# Patient Record
Sex: Male | Born: 1950 | Race: White | Hispanic: No | Marital: Married | Smoking: Never smoker
Health system: Southern US, Community
[De-identification: ages and names within clinical notes are randomized; demographics above are authoritative.]

## PROBLEM LIST (undated history)

## (undated) DIAGNOSIS — Z8739 Personal history of other diseases of the musculoskeletal system and connective tissue: Secondary | ICD-10-CM

## (undated) DIAGNOSIS — Z87442 Personal history of urinary calculi: Secondary | ICD-10-CM

## (undated) DIAGNOSIS — R011 Cardiac murmur, unspecified: Secondary | ICD-10-CM

## (undated) DIAGNOSIS — N210 Calculus in bladder: Secondary | ICD-10-CM

## (undated) DIAGNOSIS — E785 Hyperlipidemia, unspecified: Secondary | ICD-10-CM

## (undated) DIAGNOSIS — Z9289 Personal history of other medical treatment: Secondary | ICD-10-CM

## (undated) DIAGNOSIS — M1990 Unspecified osteoarthritis, unspecified site: Secondary | ICD-10-CM

## (undated) DIAGNOSIS — R112 Nausea with vomiting, unspecified: Secondary | ICD-10-CM

## (undated) DIAGNOSIS — R350 Frequency of micturition: Secondary | ICD-10-CM

## (undated) DIAGNOSIS — Z973 Presence of spectacles and contact lenses: Secondary | ICD-10-CM

## (undated) DIAGNOSIS — N200 Calculus of kidney: Secondary | ICD-10-CM

## (undated) DIAGNOSIS — I10 Essential (primary) hypertension: Secondary | ICD-10-CM

## (undated) DIAGNOSIS — Z789 Other specified health status: Secondary | ICD-10-CM

## (undated) DIAGNOSIS — Z9889 Other specified postprocedural states: Secondary | ICD-10-CM

## (undated) DIAGNOSIS — Z87828 Personal history of other (healed) physical injury and trauma: Secondary | ICD-10-CM

## (undated) DIAGNOSIS — Z86010 Personal history of colonic polyps: Secondary | ICD-10-CM

## (undated) HISTORY — PX: APPENDECTOMY: SHX54

## (undated) HISTORY — PX: TONSILLECTOMY: SUR1361

---

## 1997-10-12 ENCOUNTER — Inpatient Hospital Stay (HOSPITAL_COMMUNITY): Admission: RE | Admit: 1997-10-12 | Discharge: 1997-10-12 | Payer: Self-pay | Admitting: Neurosurgery

## 1997-11-04 ENCOUNTER — Ambulatory Visit (HOSPITAL_COMMUNITY): Admission: RE | Admit: 1997-11-04 | Discharge: 1997-11-04 | Payer: Self-pay | Admitting: Neurosurgery

## 1997-11-04 ENCOUNTER — Encounter: Payer: Self-pay | Admitting: Neurosurgery

## 1997-12-06 ENCOUNTER — Encounter: Payer: Self-pay | Admitting: Neurosurgery

## 1997-12-06 ENCOUNTER — Ambulatory Visit (HOSPITAL_COMMUNITY): Admission: RE | Admit: 1997-12-06 | Discharge: 1997-12-06 | Payer: Self-pay | Admitting: Neurosurgery

## 2011-01-04 ENCOUNTER — Encounter: Payer: Self-pay | Admitting: Emergency Medicine

## 2011-01-04 ENCOUNTER — Emergency Department (HOSPITAL_COMMUNITY): Payer: Self-pay

## 2011-01-04 ENCOUNTER — Inpatient Hospital Stay (HOSPITAL_COMMUNITY)
Admission: EM | Admit: 2011-01-04 | Discharge: 2011-01-08 | DRG: 699 | Disposition: A | Discharge status: Home / Self Care | Payer: Self-pay | Attending: Surgery | Admitting: Surgery

## 2011-01-04 DIAGNOSIS — D62 Acute posthemorrhagic anemia: Secondary | ICD-10-CM | POA: Diagnosis present

## 2011-01-04 DIAGNOSIS — S37039A Laceration of unspecified kidney, unspecified degree, initial encounter: Principal | ICD-10-CM | POA: Diagnosis present

## 2011-01-04 DIAGNOSIS — W19XXXA Unspecified fall, initial encounter: Secondary | ICD-10-CM | POA: Diagnosis present

## 2011-01-04 DIAGNOSIS — E669 Obesity, unspecified: Secondary | ICD-10-CM | POA: Diagnosis present

## 2011-01-04 DIAGNOSIS — S37019A Minor contusion of unspecified kidney, initial encounter: Secondary | ICD-10-CM

## 2011-01-04 DIAGNOSIS — S37032A Laceration of left kidney, unspecified degree, initial encounter: Secondary | ICD-10-CM | POA: Diagnosis present

## 2011-01-04 DIAGNOSIS — Z6841 Body Mass Index (BMI) 40.0 and over, adult: Secondary | ICD-10-CM

## 2011-01-04 DIAGNOSIS — W108XXA Fall (on) (from) other stairs and steps, initial encounter: Secondary | ICD-10-CM | POA: Diagnosis present

## 2011-01-04 DIAGNOSIS — K661 Hemoperitoneum: Secondary | ICD-10-CM

## 2011-01-04 HISTORY — DX: Other specified health status: Z78.9

## 2011-01-04 LAB — BASIC METABOLIC PANEL
BUN: 22 mg/dL (ref 6–23)
CO2: 21 mEq/L (ref 19–32)
Calcium: 9.1 mg/dL (ref 8.4–10.5)
Chloride: 101 mEq/L (ref 96–112)
Creatinine, Ser: 1.7 mg/dL — ABNORMAL HIGH (ref 0.50–1.35)
GFR calc Af Amer: 49 mL/min — ABNORMAL LOW (ref >90)
GFR calc non Af Amer: 42 mL/min — ABNORMAL LOW (ref >90)
Glucose, Bld: 151 mg/dL — ABNORMAL HIGH (ref 70–99)
Potassium: 4.1 mEq/L (ref 3.5–5.1)
Sodium: 137 mEq/L (ref 135–145)

## 2011-01-04 LAB — CBC
Hemoglobin: 11.6 g/dL — ABNORMAL LOW (ref 13.0–17.0)
MCH: 28.2 pg (ref 26.0–34.0)
MCHC: 32.6 g/dL (ref 30.0–36.0)
MCV: 86.4 fL (ref 78.0–100.0)
RBC: 4.12 MIL/uL — ABNORMAL LOW (ref 4.22–5.81)

## 2011-01-04 LAB — DIFFERENTIAL
Eosinophils Absolute: 0 10*3/uL (ref 0.0–0.7)
Eosinophils Relative: 0 % (ref 0–5)
Lymphs Abs: 1 10*3/uL (ref 0.7–4.0)
Monocytes Relative: 5 % (ref 3–12)

## 2011-01-04 LAB — GLUCOSE, CAPILLARY: Glucose-Capillary: 128 mg/dL — ABNORMAL HIGH (ref 70–99)

## 2011-01-04 LAB — HEMOGLOBIN AND HEMATOCRIT, BLOOD: Hemoglobin: 12.1 g/dL — ABNORMAL LOW (ref 13.0–17.0)

## 2011-01-04 LAB — APTT: aPTT: 21 seconds — ABNORMAL LOW (ref 24–37)

## 2011-01-04 LAB — PROTIME-INR: INR: 1.03 (ref 0.00–1.49)

## 2011-01-04 LAB — MRSA PCR SCREENING: MRSA by PCR: NEGATIVE

## 2011-01-04 LAB — ABO/RH: ABO/RH(D): O POS

## 2011-01-04 MED ORDER — ONDANSETRON HCL 4 MG PO TABS: 4.0000 mg | ORAL_TABLET | Freq: Four times a day (QID) | ORAL | Status: DC | PRN

## 2011-01-04 MED ORDER — SODIUM CHLORIDE 0.9 % IV BOLUS (SEPSIS)
1000.0000 mL | Freq: Once | INTRAVENOUS | Status: AC
  Administered 2011-01-04: 1000 mL via INTRAVENOUS

## 2011-01-04 MED ORDER — PANTOPRAZOLE SODIUM 40 MG IV SOLR
40.0000 mg | Freq: Every day | INTRAVENOUS | Status: DC
  Administered 2011-01-05: 40 mg via INTRAVENOUS
  Filled 2011-01-04 (×2): qty 40

## 2011-01-04 MED ORDER — ACETAMINOPHEN 325 MG PO TABS: 650.0000 mg | ORAL_TABLET | ORAL | Status: DC | PRN

## 2011-01-04 MED ORDER — IOHEXOL 300 MG/ML  SOLN
100.0000 mL | Freq: Once | INTRAMUSCULAR | Status: AC | PRN
  Administered 2011-01-04: 100 mL via INTRAVENOUS

## 2011-01-04 MED ORDER — HYDROMORPHONE HCL PF 1 MG/ML IJ SOLN
1.0000 mg | INTRAMUSCULAR | Status: DC | PRN
  Administered 2011-01-04 – 2011-01-05 (×3): 1 mg via INTRAVENOUS
  Filled 2011-01-04 (×3): qty 1

## 2011-01-04 MED ORDER — PANTOPRAZOLE SODIUM 40 MG PO TBEC
40.0000 mg | DELAYED_RELEASE_TABLET | Freq: Every day | ORAL | Status: DC
  Administered 2011-01-04: 40 mg via ORAL
  Filled 2011-01-04: qty 1

## 2011-01-04 MED ORDER — ONDANSETRON HCL 4 MG/2ML IJ SOLN
4.0000 mg | Freq: Four times a day (QID) | INTRAMUSCULAR | Status: DC | PRN
  Administered 2011-01-04: 4 mg via INTRAVENOUS
  Filled 2011-01-04: qty 2

## 2011-01-04 MED ORDER — DEXTROSE IN LACTATED RINGERS 5 % IV SOLN
INTRAVENOUS | Status: DC
  Administered 2011-01-04: 18:00:00 via INTRAVENOUS
  Administered 2011-01-05 – 2011-01-06 (×2): 1000 mL via INTRAVENOUS

## 2011-01-04 MED ORDER — HYDROMORPHONE HCL PF 1 MG/ML IJ SOLN
1.0000 mg | Freq: Once | INTRAMUSCULAR | Status: AC
  Administered 2011-01-04: 1 mg via INTRAVENOUS
  Filled 2011-01-04: qty 1

## 2011-01-04 MED ORDER — HYDROMORPHONE HCL PF 1 MG/ML IJ SOLN
1.0000 mg | Freq: Once | INTRAMUSCULAR | Status: DC
  Filled 2011-01-04: qty 1

## 2011-01-04 NOTE — ED Provider Notes (Signed)
History     CSN: [id number] Arrival date & time: 01/04/2011  9:00 AM   First MD Initiated Contact with Patient 01/04/11 0901      Chief Complaint  Patient presents with  . Abdominal Injury    Ft fell 12 hrs ago. Full body weight struck concrete sidewalk. increased pain during the night. L/flank area main complaint    Patient is a 60 y.o. male presenting with abdominal pain. The history is provided by the patient.  Abdominal Pain The primary symptoms of the illness include abdominal pain, fatigue and nausea. The primary symptoms of the illness do not include fever or shortness of breath. Primary symptoms comment: denies chest pain The current episode started yesterday. The onset of the illness was gradual. The problem has been rapidly worsening.  Associated with: recent fall. Additional symptoms associated with the illness include diaphoresis.  Pt with fall last night, reports he was walking up concrete steps, he slipped and fell down.  He reports his arms were forced into his abdomen He denies head injury He denies LOC He denies neck/back pain No CP/SOB reported  No past medical history on file.  No past surgical history on file.  No family history on file.  History  Substance Use Topics  . Smoking status: Not on file  . Smokeless tobacco: Not on file  . Alcohol Use: Not on file      Review of Systems  Constitutional: Positive for diaphoresis and fatigue. Negative for fever.  Respiratory: Negative for shortness of breath.   Gastrointestinal: Positive for nausea and abdominal pain.  All other systems reviewed and are negative.    Allergies  Review of patient's allergies indicates not on file.  Home Medications  No current outpatient prescriptions on file.  BP 132/72  Pulse 91  Resp 22  SpO2 100%  Physical Exam  CONSTITUTIONAL:  diaphoretic, ill appearing HEAD AND FACE: Normocephalic/atraumatic EYES: EOMI/PERRL ENMT: Mucous membranes moist NECK: supple no  meningeal signs SPINE:entire spine nontender, NEXUS criteria met CV: S1/S2 noted, no murmurs/rubs/gallops noted Chest - nontender, no crepitance LUNGS: Lungs are clear to auscultation bilaterally, no apparent distress ABDOMEN: significant tenderness to LUQ but no significant bruising noted GU:no cva tenderness NEURO: Pt is awake/alert, moves all extremitiesx4, GCS 15 EXTREMITIES: pulses normal, full ROM, no tenderness SKIN: warm, color normal PSYCH: no abnormalities of mood noted   ED Course  Procedures   CRITICAL CARE Performed by: Joya Gaskins   Total critical care time: 45  Critical care time was exclusive of separately billable procedures and treating other patients.  Critical care was necessary to treat or prevent imminent or life-threatening deterioration.  Critical care was time spent personally by me on the following activities: development of treatment plan with patient and/or surrogate as well as nursing, discussions with consultants, evaluation of patient's response to treatment, examination of patient, obtaining history from patient or surrogate, ordering and performing treatments and interventions, ordering and review of laboratory studies, ordering and review of radiographic studies, pulse oximetry and re-evaluation of patient's condition.    Labs Reviewed  BASIC METABOLIC PANEL  CBC  DIFFERENTIAL    9:39 AM Pt with fall yesterday, landed on abdomen, now with severe LUQ pain/tenderness, he is diaphoretic I am concerned with splenic/intra-abdominal injury I have called radiology (dr Fredirick Lathe) and discussed this concern and I will order CT A/p emergently and not wait on creatinine as my suspicion is very high for abd injury  10:16 AM D/w radiology Pt has large  renal hematoma Consult to trauma/urology  Urology [facility]) aware of patient Awaiting surgery call  10:30 AM D/w surgery Caryn Bee) will see patient and will d/w his attending weatherly  11:09  AM Pt seen by urology Seen by trauma They want transfer to [facility] for stepdown bed Pt stable at this time   MDM  Nursing notes reviewed and considered in documentation        Date: 01/04/2011  Rate: 105  Rhythm: sinus tachycardia  QRS Axis: normal  Intervals: QT prolonged  ST/T Wave abnormalities: nonspecific ST changes  Conduction Disutrbances:none  Narrative Interpretation:   Old EKG Reviewed: none available    Joya Gaskins, MD 01/04/11 1109

## 2011-01-04 NOTE — H&P (Signed)
Chief Complaint: Fall, Renal laceration HPI: Alexander Lynn is an 60 y.o. male who was visitin some family last pm about 8:30 when he tripped going up a step to the sidewalk. He was carrying some books and fell onto his left side with his elbow tucked against his (L)side. He immediately felt pain on that side but thought it would just be a bruise or contusion. However, he developed worsening pain throughout the night, associated N/V and sweats. He also reports feeling weak when sitting up or standing. He denies dysuria or hematuria but hasn't voided yet today. He denies any other injuries. No LOC before fall or after. He came to [facility] ER for eval and there was immediate concern by EDP for splenic injury, CT scan performed and shows large (L)renal lac with hematoma. He has been seen by Urology for this and Trauma service eval and admission is requested.   Past Medical History: History reviewed. No pertinent past medical history.  Past Surgical History:  Past Surgical History  Procedure Date  . Appendectomy   . Tonsillectomy         C-spine fusion  Family History: History reviewed. No pertinent family history.  Social History:  reports that he has never smoked. He does not have any smokeless tobacco history on file. He reports that he drinks alcohol a few times per week. He reports that he does not use illicit drugs.  Allergies: No Known Allergies  Medications: Medications Prior to Admission  Medication Dose Route Frequency Provider Last Rate Last Dose  . HYDROmorphone (DILAUDID) injection 1 mg  1 mg Intravenous Once Joya Gaskins, MD   1 mg at 01/04/11 1047  . iohexol (OMNIPAQUE) 300 MG/ML solution 100 mL  100 mL Intravenous Once PRN Medication Radiologist   100 mL at 01/04/11 1005  . sodium chloride 0.9 % bolus 1,000 mL  1,000 mL Intravenous Once Joya Gaskins, MD       No current outpatient prescriptions on file as of 01/04/2011.    ROS: :Review of Systems - History obtained  from the patient General ROS: positive for  -sweats ENT ROS: negative Respiratory ROS: no cough, shortness of breath, or wheezing Cardiovascular ROS: no chest pain or dyspnea on exertion Gastrointestinal ROS: no change in bowel habits, or black or bloody stools Genito-Urinary ROS: no dysuria, trouble voiding, or hematuria Musculoskeletal ROS: negative Neurological ROS: no TIA or stroke symptoms  Blood pressure 132/72, pulse 91, resp. rate 22, SpO2 100.00%. There is no height or weight on file to calculate BMI.   General Appearance:  Alert, cooperative, mod distress, appears stated age  Head:  Normocephalic, without obvious abnormality, atraumatic  ENT: Unremarkable  Neck: Supple, symmetrical, trachea midline, no adenopathy, thyroid: not enlarged, symmetric, no tenderness/mass/nodules  Lungs:   Clear to auscultation bilaterally, no w/r/r, respirations unlabored without use of accessory muscles.  Chest Wall:  No tenderness or deformity  Heart:  Regular rate and rhythm, S1, S2 normal, no murmur, rub or gallop. Carotids 2+ without bruit.  Abdomen:   Soft, tender (L)UQ and flank, non distended. Bowel sounds active all four quadrants,  no masses, no organomegaly.  Genitalia:  Normal. No hernias  Rectal:  Deferred.  Extremities: Extremities normal, atraumatic, no cyanosis or edema  Pulses: 2+ and symmetric  Skin: Skin color, texture, turgor normal, no rashes or lesions  Neurologic: Normal affect, no gross deficits.   No results found for this or any previous visit (from the past 48 hour(s)). Ct Abdomen  Pelvis W Contrast  01/04/2011  *RADIOLOGY REPORT*  Clinical Data: Fall, pain, left upper quadrant pain.  CT ABDOMEN AND PELVIS WITH CONTRAST  Technique:  Multidetector CT imaging of the abdomen and pelvis was performed following the standard protocol during bolus administration of intravenous contrast.  Contrast: OMNIPAQUE IOHEXOL 300 MG/ML IV SOLN  Comparison: None.  Findings: There is a  large left renal subcapsular hematoma.  This measures 9 x 6.6 cm.  This causes significant compression of the left kidney and appears to be decreased perfusion to the kidney. Blood also extends out into the retroperitoneum with a large retroperitoneal hematoma in the abdomen and into the pelvis.  Blood tracks along the left renal vein.  No active extravasation of contrast is visualized.  Liver, gallbladder, spleen, pancreas and adrenals are unremarkable. Small low-density areas within the right kidney compatible with benign cysts.  Punctate nonobstructing stones in both kidneys.  No hydronephrosis.  Aorta and iliac vessels are calcified, non-aneurysmal.  No acute bony abnormality.  Minimal dependent atelectasis in the lung bases.  No pleural effusions.  Heart is normal size.  IMPRESSION: Large left renal subcapsular hematoma causing significant compression on the left kidney and decreasing perfusion of the left kidney.  There is also a large associated retroperitoneal hematoma.  Critical Value/emergent results were called by telephone at the time of interpretation on 01/04/2011  at 10:14 a.m.  to  Dr. Bebe Shaggy, who verbally acknowledged these results.  Original Report Authenticated By: Cyndie Chime, M.D.   Dg Chest Port 1 View  01/04/2011  *RADIOLOGY REPORT*  Clinical Data: Fall, pain  PORTABLE CHEST - 1 VIEW  Comparison: None.  Findings: The cardiac silhouette, mediastinum, pulmonary vasculature are within normal limits.  Both lungs are clear.  There is no evidence of rib fracture pneumothorax bilaterally.  IMPRESSION: There is no evidence of acute cardiac or pulmonary process.  Original Report Authenticated By: Brandon Melnick, M.D.    Assessment: Principal Problem:  *Laceration of left kidney Active Problems:  Fall  Plan: D/W Dr. Margarita Grizzle, who feels this is level II kidney lac. He recs close observation and bedrest, serial labs. Will admit to [facility] Trauma service with Urology to  consult. Labs pending, Discussed plan of care with family.  Marianna Fuss 01/04/2011, 11:21 AM

## 2011-01-04 NOTE — H&P (Signed)
The patient has no extravasation on his CT of the abdomen and pelvis.  Will check serial Hgb and watch closely in ICU.  This patient has been seen and I agree with the findings and treatment plan.  Marta Lamas. Gae Bon, MD, FACS [phone] (pager) [phone] (direct pager) Trauma Surgeon

## 2011-01-04 NOTE — Consults (Signed)
Urology Consult  CC: Left flank & abdominal pain.  HPI: 60 year old presents with left abdominal and flank pain.  Pain is sharp in nature and constant.  Relieved with narcotics.  Associated with nausea and emesis.  Improved with lying still.  Worse with movement.  This occurred after he fell from a standing position on the ground.  He landed on his left arm.  CT reveals left subcapsular hematoma extending into the retroperitoneum.  No distinct laceration noted.  No involvement of the collecting or vascular system.  No extravasation.  It would be classified as a grade 2 renal injury.  He has voided several times since the injury without gross hematuria.  He notes decreased frequency of voiding.  I discussed with him today the management of a renal injury such as this would be serial blood monitoring, blood transfusions as needed, and bedrest until stable.  Discussed alternatives include surgery with a high likelihood of losing the kidney; I do not recommend surgery.    PMH: Fingernail fungus  PSH: Past Surgical History  Procedure Date  . Appendectomy   . Tonsillectomy   Left knee surgery Cervical spine surgery 1999  Allergies: No Known Allergies  Medications:  (Not in a hospital admission)   Social History: History   Social History  . Marital Status: Married    Spouse Name: N/A    Number of Children: N/A  . Years of Education: N/A   Occupational History  . Not on file.   Social History Main Topics  . Smoking status: Never Smoker   . Smokeless tobacco: Not on file  . Alcohol Use: Yes     occa  . Drug Use: No  . Sexually Active: No   Other Topics Concern  . Not on file   Social History Narrative  . No narrative on file    Family History: History reviewed. No pertinent family history.  Negative for renal or prostate cancer.  Review of Systems: Positive: Nausea, emesis, diaphoresis. Negative: Chest pain, shortness of breath.  A further 10 point review of  systems was negative except what is listed in the HPI.  Physical Exam: Filed Vitals:   01/04/11 0927  BP: 132/72  Pulse:   Resp:    General: No acute distress.  Awake. Head:  Normocephalic.  Atraumatic. ENT:  EOMI.  Mucous membranes moist Neck:  Supple.  No lymphadenopathy. CV:  S1 present. S2 present. Mild tachycardia. Pulmonary: Equal effort bilaterally.  Clear to auscultation bilaterally. Abdomen: Soft.  Tender to palpation diffusely.  No rebound TTP. Flank:  No flank ecchymosis bilaterally.  TTP left flank. Skin:  Normal turgor.  No visible rash. Extremity: No gross deformity of bilateral upper extremities.  No gross deformity of bilateral lower extremities. Neurologic: Alert. Appropriate mood.  Penis:  Circumcised.  No lesions. Urethra: No Foley catheter in place.  Orthotopic meatus. Scrotum: No lesions.  No ecchymosis.  No erythema. Testicles: Descended bilaterally.  No masses bilaterally.   Studies:  No results found for this basename: HGB:2,WBC:2,PLT:2 in the last 72 hours  No results found for this basename: NA:2,K:2,CL:2,CO2:2,BUN:2,CREATININE:2,CALCIUM:2,MAGNESIUM:2,GFRNONAA:2,GFRAA:2 in the last 72 hours   No results found for this basename: PT:2,INR:2,APTT:2 in the last 72 hours   No components found with this basename: ABG:2    Assessment:  Left subcapsular hematoma with retroperitoneal extension: Grade 2 renal injury.  Plan: -Admit to Trauma Service. -Assess current Hemoglobin level and then serial H&H q6 hours. -Transfuse as needed. -Bedrest until hemoglobin stable for  24-48 hours. -No need for catheter from GU point of view. -Will assess need for further imaging after he has stabilized. -Will continue to follow.    Pager: [phone]

## 2011-01-04 NOTE — ED Notes (Signed)
Pt states he fell last night and started to have abdominal pain.

## 2011-01-05 DIAGNOSIS — D62 Acute posthemorrhagic anemia: Secondary | ICD-10-CM | POA: Diagnosis present

## 2011-01-05 LAB — BASIC METABOLIC PANEL
BUN: 23 mg/dL (ref 6–23)
CO2: 28 mEq/L (ref 19–32)
Chloride: 108 mEq/L (ref 96–112)
Creatinine, Ser: 1.37 mg/dL — ABNORMAL HIGH (ref 0.50–1.35)
GFR calc Af Amer: 64 mL/min — ABNORMAL LOW (ref >90)
Potassium: 4.2 mEq/L (ref 3.5–5.1)

## 2011-01-05 LAB — HEMOGLOBIN AND HEMATOCRIT, BLOOD
HCT: 30.6 % — ABNORMAL LOW (ref 39.0–52.0)
Hemoglobin: 10.1 g/dL — ABNORMAL LOW (ref 13.0–17.0)
Hemoglobin: 9.7 g/dL — ABNORMAL LOW (ref 13.0–17.0)

## 2011-01-05 MED ORDER — HYDROCODONE-ACETAMINOPHEN 5-325 MG PO TABS: 1.0000 | ORAL_TABLET | Freq: Four times a day (QID) | ORAL | Status: DC | PRN

## 2011-01-05 NOTE — Progress Notes (Signed)
   CARE MANAGEMENT NOTE 01/05/2011  Patient:  Alexander Lynn, Alexander Lynn   Account Number:  [id number]  Date Initiated:  01/05/2011  Documentation initiated by:  Carlyle Lipa  Subjective/Objective Assessment:   fall with left kidney laceration     Action/Plan:   home when stable--no [facility] needs anticipated   Anticipated DC Date:  01/08/2011   Anticipated DC Plan:  HOME/SELF CARE      DC Planning Services  CM consult               Status of service:  In process, will continue to follow   Per UR Regulation:  Reviewed for med. necessity/level of care/duration of stay

## 2011-01-05 NOTE — Progress Notes (Signed)
Hgb dropped a little bit today from 10.1 to 9.7.  No hematuria.  This patient has been seen and I agree with the findings and treatment plan.  Marta Lamas. Gae Bon, MD, FACS [phone] (pager) [phone] (direct pager) Trauma Surgeon

## 2011-01-05 NOTE — Progress Notes (Signed)
Urology Progress Note  Subjective:     No acute urologic events overnight. Pain well controlled. Positive flatus.  Voiding clear yellow urine without difficulty. Patient remains on bedrest.  Objective:  Patient Vitals for the past 24 hrs:  BP Temp Temp src Pulse Resp SpO2 Height Weight  01/05/11 0453 - 97.8 F (36.6 C) Oral - - - - -  01/05/11 0100 148/80 mmHg - - 89  15  99 % - -  01/05/11 0053 - 97.7 F (36.5 C) Oral - - - - -  01/05/11 0000 138/76 mmHg - - 88  13  98 % - -  01/04/11 2300 148/74 mmHg - - 89  16  97 % - -  01/04/11 2200 125/84 mmHg - - 93  12  97 % - -  01/04/11 2100 160/81 mmHg - - 91  17  99 % - -  01/04/11 2000 141/72 mmHg 97.5 F (36.4 C) Oral 96  17  98 % - -  01/04/11 1900 130/77 mmHg - - 100  20  97 % - -  01/04/11 1833 144/69 mmHg - - - 16  100 % - -  01/04/11 1830 144/69 mmHg - - 101  16  98 % - -  01/04/11 1800 - - - 102  19  98 % - -  01/04/11 1730 154/78 mmHg - - 96  19  97 % 5\' 8"  (1.727 m) 109.77 kg (242 lb)  01/04/11 1700 138/78 mmHg - - 99  17  96 % - -  01/04/11 1630 147/75 mmHg - - 97  17  94 % - -  01/04/11 1615 146/78 mmHg - - 95  17  98 % - -  01/04/11 1600 147/76 mmHg 97.6 F (36.4 C) Oral 94  15  98 % 5' (1.524 m) 109.77 kg (242 lb)  01/04/11 1507 139/61 mmHg 98.7 F (37.1 C) Oral 96  16  99 % - -  01/04/11 1500 - - - 105  - 97 % - -  01/04/11 1311 167/85 mmHg 98.7 F (37.1 C) Oral 101  14  99 % - -  01/04/11 1238 152/83 mmHg 97.6 F (36.4 C) Oral 106  18  96 % - -  01/04/11 1236 152/83 mmHg - - 101  - 97 % - -  01/04/11 0927 132/72 mmHg - - - - - - -  01/04/11 0915 118/72 mmHg - Oral 91  22  100 % - -    Physical Exam: General:  No acute distress, awake Cardiovascular:    [x]   S1/S2 present, RRR  []   Irregularly irregular Chest:  CTA-B Abdomen:             Soft, appropriately TTP, no rebound TTP Genitourinary: No foley in place.     I/O last 3 completed shifts: In: 2250 [I.V.:2250] Out: -   Recent Labs  Basename  01/05/11 0048 01/04/11 1758 01/04/11 1118   HGB 10.1* 12.1* --   WBC -- -- 24.2*   PLT -- -- 271    Recent Labs  [facility] 01/04/11 1118   NA 137   K 4.1   CL 101   CO2 21   BUN 22   CREATININE 1.70*   CALCIUM 9.1   GFRNONAA 42*   GFRAA 49*     Recent Labs  Basename 01/04/11 1118   INR 1.03   APTT 21*     No components found with this basename: ABG:2  Assessment: Hospital Day #2  Left grade 2 renal injury Plan: -Continue bedrest until hemoglobin has stabilized for 24 hours. -Ambulate after stabilization of hemoglobin and then recheck to make sure stays stable after ambulation. -Reimage with CT after patient ambulating with stable hemoglobin. -Will continue to follow.   Natalia Leatherwood, MD [phone]

## 2011-01-05 NOTE — Progress Notes (Signed)
Subjective: Pain in left abd/flank area has resolved for the most part. He reports he is passing flatus.  Voiding without problems and urine is clear yellow.  Objective: Vital signs in last 24 hours: Temp:  [97.5 F (36.4 C)-98.7 F (37.1 C)] 97.8 F (36.6 C) (12/14 0453) Pulse Rate:  [78-106] 78  (12/14 0800) Resp:  [12-22] 12  (12/14 0800) BP: (118-168)/(61-85) 149/73 mmHg (12/14 0800) SpO2:  [94 %-100 %] 99 % (12/14 0800) Weight:  [109.77 kg (242 lb)] 242 lb (109.77 kg) (12/13 1730) Last BM Date: 01/03/11  Intake/Output from previous day: 12/13 0701 - 12/14 0700 In: 3932 [P.O.:480; I.V.:3450; IV Piggyback:2] Out: 350 [Urine:350] Intake/Output this shift: Total I/O In: 100 [I.V.:100] Out: 120 [Urine:120]  General appearance: alert, cooperative and no distress Resp: clear to auscultation bilaterally Cardio: regular rate and rhythm GI: soft, essentially nontender except for mildly tender left side to palpation  ; bowel sounds normal; no masses,  no organomegaly  Lab Results:   Basename 01/05/11 0625 01/05/11 0048 01/04/11 1118  WBC -- -- 24.2*  HGB 9.7* 10.1* --  HCT 29.8* 30.6* --  PLT -- -- 271   BMET  Basename 01/05/11 0625 01/04/11 1118  NA 142 137  K 4.2 4.1  CL 108 101  CO2 28 21  GLUCOSE 115* 151*  BUN 23 22  CREATININE 1.37* 1.70*  CALCIUM 8.2* 9.1   PT/INR  Basename 01/04/11 1118  LABPROT 13.7  INR 1.03   ABG No results found for this basename: PHART:2,PCO2:2,PO2:2,HCO3:2 in the last 72 hours  Studies/Results: Ct Abdomen Pelvis W Contrast  01/04/2011  *RADIOLOGY REPORT*  Clinical Data: Fall, pain, left upper quadrant pain.  CT ABDOMEN AND PELVIS WITH CONTRAST  Technique:  Multidetector CT imaging of the abdomen and pelvis was performed following the standard protocol during bolus administration of intravenous contrast.  Contrast: OMNIPAQUE IOHEXOL 300 MG/ML IV SOLN  Comparison: None.  Findings: There is a large left renal subcapsular  hematoma.  This measures 9 x 6.6 cm.  This causes significant compression of the left kidney and appears to be decreased perfusion to the kidney. Blood also extends out into the retroperitoneum with a large retroperitoneal hematoma in the abdomen and into the pelvis.  Blood tracks along the left renal vein.  No active extravasation of contrast is visualized.  Liver, gallbladder, spleen, pancreas and adrenals are unremarkable. Small low-density areas within the right kidney compatible with benign cysts.  Punctate nonobstructing stones in both kidneys.  No hydronephrosis.  Aorta and iliac vessels are calcified, non-aneurysmal.  No acute bony abnormality.  Minimal dependent atelectasis in the lung bases.  No pleural effusions.  Heart is normal size.  IMPRESSION: Large left renal subcapsular hematoma causing significant compression on the left kidney and decreasing perfusion of the left kidney.  There is also a large associated retroperitoneal hematoma.  Critical Value/emergent results were called by telephone at the time of interpretation on 01/04/2011  at 10:14 a.m.  to  Dr. Bebe Shaggy, who verbally acknowledged these results.  Original Report Authenticated By: Cyndie Chime, M.D.   Dg Chest Port 1 View  01/04/2011  *RADIOLOGY REPORT*  Clinical Data: Fall, pain  PORTABLE CHEST - 1 VIEW  Comparison: None.  Findings: The cardiac silhouette, mediastinum, pulmonary vasculature are within normal limits.  Both lungs are clear.  There is no evidence of rib fracture pneumothorax bilaterally.  IMPRESSION: There is no evidence of acute cardiac or pulmonary process.  Original Report Authenticated By: Rolan Bucco  B. Kearney Hard, M.D.    Anti-infectives: Anti-infectives    None      Assessment/Plan: Patient Active Problem List  Diagnoses  . Fall  . Laceration of left kidney--Continue bedrest until hemoglobin has stabilized for 24 hours. -Ambulate after stabilization of hemoglobin and then recheck to make sure stays stable  after ambulation. -Reimage with CT after patient ambulating with stable hemoglobin.  per Dr. Hilario Quarry rec's above        FEN- allow clear liquids, decrease IVF slightly       ABL anemia- following serial H&H, re ck CBC am for plts, etc       VTE- SCD's - no anticoagulation with renal lac       DISPO- Management per Dr. Hilario Quarry rec's - okay for transfer to SDU   LOS: 1 day    Vayla Wilhelmi,PA-C Pager [phone] General Trauma Pager [phone]

## 2011-01-06 LAB — CBC
HCT: 27.2 % — ABNORMAL LOW (ref 39.0–52.0)
MCH: 29.7 pg (ref 26.0–34.0)
MCHC: 33.5 g/dL (ref 30.0–36.0)
MCV: 88.9 fL (ref 78.0–100.0)
Platelets: 221 10*3/uL (ref 150–400)
RDW: 15 % (ref 11.5–15.5)

## 2011-01-06 LAB — RENAL FUNCTION PANEL
Albumin: 3 g/dL — ABNORMAL LOW (ref 3.5–5.2)
BUN: 13 mg/dL (ref 6–23)
Calcium: 8.1 mg/dL — ABNORMAL LOW (ref 8.4–10.5)
Glucose, Bld: 107 mg/dL — ABNORMAL HIGH (ref 70–99)
Phosphorus: 2.6 mg/dL (ref 2.3–4.6)
Potassium: 3.9 mEq/L (ref 3.5–5.1)

## 2011-01-06 NOTE — Progress Notes (Signed)
Nursing report given to [facility] RN on 5100, to transfer to 5128 via wheelchair, VSS, NSL, tolerating PO well, Berle Mull RN

## 2011-01-06 NOTE — Progress Notes (Signed)
I have seen and examined the patient and agree with the assessment and plans  

## 2011-01-06 NOTE — Progress Notes (Signed)
Patient ID: SHALON COUNCILMAN, male   DOB: 1950/02/20, 60 y.o.   MRN: [id number]    Subjective: Pain is very manageable per patient without medications. He has been to BR and stood at sink and does not move quickly, but no dizziness or other problems Urine- clear yellow Objective: Vital signs in last 24 hours: Temp:  [97.5 F (36.4 C)-98.3 F (36.8 C)] 97.9 F (36.6 C) (12/15 0845) Pulse Rate:  [74-106] 79  (12/15 0845) Resp:  [14-26] 20  (12/15 0845) BP: (135-177)/(71-80) 164/80 mmHg (12/15 0845) SpO2:  [96 %-99 %] 96 % (12/15 0845) Last BM Date: 01/04/11  Intake/Output from previous day: 12/14 0701 - 12/15 0700 In: 2090 [P.O.:480; I.V.:1600; IV Piggyback:10] Out: 2570 [Urine:2570] Intake/Output this shift: Total I/O In: 750 [P.O.:600; I.V.:150] Out: -   General appearance: alert, cooperative and no distress Resp: clear to auscultation bilaterally Cardio: regular rate and rhythm GI: soft, essentially nontender except for very mildly tender left side to palpation   ; bowel sounds normal; no masses,  no organomegaly  Lab Results:   Basename 01/06/11 0501 01/05/11 0625 01/04/11 1118  WBC 10.9* -- 24.2*  HGB 9.1* 9.7* --  HCT 27.2* 29.8* --  PLT 221 -- 271   BMET  Basename 01/06/11 0530 01/05/11 0625  NA 140 142  K 3.9 4.2  CL 106 108  CO2 28 28  GLUCOSE 107* 115*  BUN 13 23  CREATININE 1.16 1.37*  CALCIUM 8.1* 8.2*   PT/INR  Basename 01/04/11 1118  LABPROT 13.7  INR 1.03    Anti-infectives: Anti-infectives    None      Assessment/Plan: Patient Active Problem List  Diagnoses  . Fall  . Laceration of left kidney--Continue to gradually increase activities as noted  Follow up CBC/BMET am -Reimage with CT after patient ambulating with stable hemoglobin.  per Dr. Hilario Quarry         FEN- Advance to reg diet and NSL       ABL anemia- follow up  In am       VTE- SCD's - no anticoagulation with renal lac       DISPO- Management per Urology's rec's,  Transfer to floor  LOS:3 days  Chantalle Defilippo,PA-C Pager [phone] General Trauma Pager [phone]

## 2011-01-06 NOTE — Progress Notes (Signed)
Subjective: Patient reports flank pain mild. He indicates that the pain is not increasing in any way and in fact he said that he is no longer requiring any pain medication to control the discomfort. He said he has been getting up and going to the bathroom. He finds it is uncomfortable to rollover on his left side.  Objective: Vital signs in last 24 hours: Temp:  [97.1 F (36.2 C)-98.3 F (36.8 C)] 98.3 F (36.8 C) (12/15 0508) Pulse Rate:  [74-106] 106  (12/14 1900) Resp:  [12-26] 15  (12/14 1900) BP: (128-177)/(71-82) 146/77 mmHg (12/15 0508) SpO2:  [96 %-100 %] 96 % (12/15 0508)  Intake/Output from previous day: 12/14 0701 - 12/15 0700 In: 1940 [P.O.:480; I.V.:1450; IV Piggyback:10] Out: 2570 [Urine:2570] Intake/Output this shift: Total I/O In: 1230 [P.O.:480; I.V.:750] Out: 975 [Urine:975]  Physical Exam:  General:alert GI: not done and soft, non tender, normal bowel sounds, no palpable masses  Lab Results:  Basename 01/05/11 0625 01/05/11 0048 01/04/11 1758  HGB 9.7* 10.1* 12.1*  HCT 29.8* 30.6* 36.1*   BMET  Basename 01/05/11 0625 01/04/11 1118  NA 142 137  K 4.2 4.1  CL 108 101  CO2 28 21  GLUCOSE 115* 151*  BUN 23 22  CREATININE 1.37* 1.70*  CALCIUM 8.2* 9.1    Basename 01/04/11 1118  LABPT --  INR 1.03   No results found for this basename: LABURIN:1 in the last 72 hours Results for orders placed during the hospital encounter of 01/04/11  MRSA PCR SCREENING     Status: Normal   Collection Time   01/04/11  4:04 PM      Component Value Range Status Comment   MRSA by PCR NEGATIVE  NEGATIVE  Final     Studies/Results: Ct Abdomen Pelvis W Contrast  01/04/2011  *RADIOLOGY REPORT*  Clinical Data: Fall, pain, left upper quadrant pain.  CT ABDOMEN AND PELVIS WITH CONTRAST  Technique:  Multidetector CT imaging of the abdomen and pelvis was performed following the standard protocol during bolus administration of intravenous contrast.  Contrast:  OMNIPAQUE IOHEXOL 300 MG/ML IV SOLN  Comparison: None.  Findings: There is a large left renal subcapsular hematoma.  This measures 9 x 6.6 cm.  This causes significant compression of the left kidney and appears to be decreased perfusion to the kidney. Blood also extends out into the retroperitoneum with a large retroperitoneal hematoma in the abdomen and into the pelvis.  Blood tracks along the left renal vein.  No active extravasation of contrast is visualized.  Liver, gallbladder, spleen, pancreas and adrenals are unremarkable. Small low-density areas within the right kidney compatible with benign cysts.  Punctate nonobstructing stones in both kidneys.  No hydronephrosis.  Aorta and iliac vessels are calcified, non-aneurysmal.  No acute bony abnormality.  Minimal dependent atelectasis in the lung bases.  No pleural effusions.  Heart is normal size.  IMPRESSION: Large left renal subcapsular hematoma causing significant compression on the left kidney and decreasing perfusion of the left kidney.  There is also a large associated retroperitoneal hematoma.  Critical Value/emergent results were called by telephone at the time of interpretation on 01/04/2011  at 10:14 a.m.  to  Dr. Bebe Shaggy, who verbally acknowledged these results.  Original Report Authenticated By: Cyndie Chime, M.D.   Dg Chest Port 1 View  01/04/2011  *RADIOLOGY REPORT*  Clinical Data: Fall, pain  PORTABLE CHEST - 1 VIEW  Comparison: None.  Findings: The cardiac silhouette, mediastinum, pulmonary vasculature are within normal  limits.  Both lungs are clear.  There is no evidence of rib fracture pneumothorax bilaterally.  IMPRESSION: There is no evidence of acute cardiac or pulmonary process.  Original Report Authenticated By: Brandon Melnick, M.D.    Assessment/Plan: He has a large left renal hematoma with associated laceration that clinically appears to be stable. His hemoglobin has fallen slightly but his hemoglobin for this morning remains  pending at this time. Clinically he appears to be stable with stable vital signs and no increase in pain and in fact very little discomfort whatsoever.  Continue bedrest with bathroom privileges.  Serial hemoglobins.  It would appear transfusion is currently not necessary.  Await his most recent blood work results.   LOS: 2 days   Crystol Walpole C 01/06/2011, 5:27 AM

## 2011-01-07 LAB — CBC
HCT: 29.5 % — ABNORMAL LOW (ref 39.0–52.0)
Hemoglobin: 9.7 g/dL — ABNORMAL LOW (ref 13.0–17.0)

## 2011-01-07 LAB — BASIC METABOLIC PANEL
BUN: 14 mg/dL (ref 6–23)
Chloride: 106 mEq/L (ref 96–112)
Glucose, Bld: 110 mg/dL — ABNORMAL HIGH (ref 70–99)
Potassium: 3.7 mEq/L (ref 3.5–5.1)

## 2011-01-07 MED ORDER — METOPROLOL TARTRATE 25 MG PO TABS
25.0000 mg | ORAL_TABLET | Freq: Once | ORAL | Status: AC
  Administered 2011-01-07: 25 mg via ORAL
  Filled 2011-01-07: qty 1

## 2011-01-07 NOTE — Progress Notes (Signed)
  Subjective: Patient reports that he feels fine. He denies any flank pain. He has been up and ambulating.  Objective: Vital signs in last 24 hours: Temp:  [97.9 F (36.6 C)-98.9 F (37.2 C)] 98 F (36.7 C) (12/16 0538) Pulse Rate:  [85-94] 85  (12/16 0538) Resp:  [18] 18  (12/16 0538) BP: (151-181)/(74-88) 161/88 mmHg (12/16 0538) SpO2:  [95 %-98 %] 97 % (12/16 0538)  Intake/Output from previous day: 12/15 0701 - 12/16 0700 In: 1020 [P.O.:720; I.V.:300] Out: 650 [Urine:650] Intake/Output this shift: Total I/O In: 480 [P.O.:480] Out: -   Physical Exam:  His abdomen is soft and nontender without mass. He had no significant CVAT.  Lab Results:  Basename 01/07/11 0500 01/06/11 0501 01/05/11 0625  HGB 9.7* 9.1* 9.7*  HCT 29.5* 27.2* 29.8*   BMET  Basename 01/07/11 0500 01/06/11 0530  NA 139 140  K 3.7 3.9  CL 106 106  CO2 26 28  GLUCOSE 110* 107*  BUN 14 13  CREATININE 1.17 1.16  CALCIUM 8.6 8.1*    Basename 01/04/11 1118  LABPT --  INR 1.03   No results found for this basename: LABURIN:1 in the last 72 hours Results for orders placed during the hospital encounter of 01/04/11  MRSA PCR SCREENING     Status: Normal   Collection Time   01/04/11  4:04 PM      Component Value Range Status Comment   MRSA by PCR NEGATIVE  NEGATIVE  Final     Studies/Results: No results found.  Assessment/Plan: He is doing very well with a stable hemoglobin and hematocrit and no progression in his flank pain. His creatinine is also normal. He has now been moved to a floor bed and has begun ambulating. As long as his hemoglobin remained stable over the next 24 hours with ambulation he may be safely discharged.  1. Ambulation in the halls 3 times a day and when necessary. 2. Recheck hemoglobin in the morning and if stable he may be discharged home with followup in 2 weeks.   LOS: 3 days   Chauntay Paszkiewicz C 01/07/2011, 10:47 AM

## 2011-01-07 NOTE — Progress Notes (Signed)
Patient ID: Alexander Lynn, male   DOB: 01/16/1951, 60 y.o.   MRN: [id number]    Subjective: Pt doing well.  Pain controlled.  Wants to go home.  Objective: Vital signs in last 24 hours: Temp:  [97.9 F (36.6 C)-98.9 F (37.2 C)] 98 F (36.7 C) (12/16 0538) Pulse Rate:  [85-94] 85  (12/16 0538) Resp:  [18] 18  (12/16 0538) BP: (151-181)/(74-88) 161/88 mmHg (12/16 0538) SpO2:  [95 %-98 %] 97 % (12/16 0538) Last BM Date: 01/06/11  Intake/Output from previous day: 12/15 0701 - 12/16 0700 In: 1020 [P.O.:720; I.V.:300] Out: 650 [Urine:650] Intake/Output this shift: Total I/O In: 480 [P.O.:480] Out: -   General appearance: alert, cooperative and no distress Resp: clear to auscultation bilaterally Cardio: regular rate and rhythm GI: soft, non tender, non distended.    Lab Results:   Basename 01/07/11 0500 01/06/11 0501  WBC 11.3* 10.9*  HGB 9.7* 9.1*  HCT 29.5* 27.2*  PLT 244 221   BMET  Basename 01/07/11 0500 01/06/11 0530  NA 139 140  K 3.7 3.9  CL 106 106  CO2 26 28  GLUCOSE 110* 107*  BUN 14 13  CREATININE 1.17 1.16  CALCIUM 8.6 8.1*   PT/INR No results found for this basename: LABPROT:2,INR:2 in the last 72 hours  Anti-infectives: Anti-infectives    None      Assessment/Plan: Patient Active Problem List  Diagnoses  . Fall  . Laceration of left kidney--Continue to gradually increase activities as noted  Follow up CBC/BMET am -Reimage with CT after patient ambulating with stable hemoglobin.  per Dr. Hilario Quarry         FEN- Advance to reg diet and NSL       ABL anemia- follow up  In am       VTE- SCD's - no anticoagulation with renal lac       DISPO- Management per Urology's rec's, Transfer to floor  LOS:3 days

## 2011-01-07 NOTE — Progress Notes (Deleted)
Orthopantogram not done d/t pt. inability to sit/stand by self. MD notified 12/15,however X-Ray called this a.m. to suggest CT Scan.

## 2011-01-08 ENCOUNTER — Encounter (HOSPITAL_COMMUNITY): Payer: Self-pay | Admitting: Radiology

## 2011-01-08 ENCOUNTER — Inpatient Hospital Stay (HOSPITAL_COMMUNITY): Payer: Self-pay

## 2011-01-08 DIAGNOSIS — Z7289 Other problems related to lifestyle: Secondary | ICD-10-CM | POA: Insufficient documentation

## 2011-01-08 DIAGNOSIS — E669 Obesity, unspecified: Secondary | ICD-10-CM | POA: Insufficient documentation

## 2011-01-08 DIAGNOSIS — S37009A Unspecified injury of unspecified kidney, initial encounter: Secondary | ICD-10-CM

## 2011-01-08 LAB — CBC
HCT: 31.6 % — ABNORMAL LOW (ref 39.0–52.0)
Hemoglobin: 10.3 g/dL — ABNORMAL LOW (ref 13.0–17.0)
MCH: 28.6 pg (ref 26.0–34.0)
MCHC: 32.6 g/dL (ref 30.0–36.0)
RBC: 3.6 MIL/uL — ABNORMAL LOW (ref 4.22–5.81)

## 2011-01-08 MED ORDER — ACETAMINOPHEN 325 MG PO TABS: 650.0000 mg | ORAL_TABLET | ORAL | Status: AC | PRN

## 2011-01-08 MED ORDER — BLOOD PRESSURE CONTROL BOOK
Freq: Once | Status: AC
  Administered 2011-01-08: 07:00:00
  Filled 2011-01-08: qty 1

## 2011-01-08 MED ORDER — IOHEXOL 300 MG/ML  SOLN
100.0000 mL | Freq: Once | INTRAMUSCULAR | Status: AC | PRN
  Administered 2011-01-08: 100 mL via INTRAVENOUS

## 2011-01-08 NOTE — Discharge Summary (Signed)
Physician Discharge Summary  Patient ID: Alexander Lynn MRN: [id number] DOB/AGE: Apr 02, 1950 60 y.o.  Admit date: 01/04/2011 Discharge date: 01/08/2011  Discharge Diagnoses Patient Active Problem List  Diagnoses Date Noted  . Alcohol use 01/08/2011  . Elevated blood pressure 01/08/2011  . Obesity 01/08/2011  . Acute blood loss anemia 01/05/2011  . Fall 01/04/2011  . Laceration of left kidney 01/04/2011    Consultants Dr. Margarita Grizzle for urology  Procedures None  HPI: Alexander Lynn is an 60 y.o. male who was visitin some family last pm about 8:30 when he tripped going up a step to the sidewalk. He was carrying some books and fell onto his left side with his elbow tucked against his (L)side. He immediately felt pain on that side but thought it would just be a bruise or contusion. However, he developed worsening pain throughout the night, associated N/V and sweats. He also reports feeling weak when sitting up or standing. He denies dysuria or hematuria but hasn't voided yet today. He denies any other injuries. No LOC before fall or after. He came to [facility] ER for eval and there was immediate concern by EDP for splenic injury, CT scan performed and shows large (L)renal lac with hematoma. He has been seen by Urology for this and Trauma service eval and admission is requested.    Hospital Course: The patient did well while in the hospital. He did have some mild acute blood loss anemia which did not require transfusion and was stable at time of discharge. He had minimal pain and in fact took no pain medication during his last 2 hospital days. Urology had requested a CT scan prior to discharge which showed a stable perinephric hematoma. His blood pressures remained elevated while here in the hospital and we recommend outpatient followup with a primary care provider. The patient was able to be discharged home in good condition.    Current Discharge Medication List    START taking these  medications   Details  acetaminophen (TYLENOL) 325 MG tablet Take 2 tablets (650 mg total) by mouth every 4 (four) hours as needed.      CONTINUE these medications which have NOT CHANGED   Details  glucosamine-chondroitin 500-400 MG tablet Take 1 tablet by mouth every morning.      Multiple Vitamins-Minerals (MULTIVITAMINS THER. W/MINERALS) TABS Take 1 tablet by mouth daily.        STOP taking these medications     naproxen sodium (ANAPROX) 220 MG tablet          Follow-up Information    Follow up with Milford Cage, MD. Make an appointment in 2 weeks.   Contact information:   [address] [facility] [place] [address] [phone]    Follow up with PRIMARY CARE PROVIDER for your elevated blood pressures within 1 month.      Signed: Freeman Caldron, PA-C Pager: [phone] General Trauma PA Pager: [phone]  01/08/2011, 3:02 PM

## 2011-01-08 NOTE — Progress Notes (Signed)
Patient ID: Alexander Lynn, male   DOB: February 22, 1950, 60 y.o.   MRN: [id number]   LOS: 4 days   Subjective: Feels great. Wants to go home.  Objective: Vital signs in last 24 hours: Temp:  [97.6 F (36.4 C)-98.6 F (37 C)] 97.9 F (36.6 C) (12/17 0536) Pulse Rate:  [81-98] 81  (12/17 0536) Resp:  [18] 18  (12/17 0536) BP: (170-185)/(72-85) 175/83 mmHg (12/17 0536) SpO2:  [96 %-100 %] 98 % (12/17 0536) Last BM Date: 01/06/11  CBC: Pending  CT Abd/pelvis: Pending  General appearance: alert and no distress Resp: clear to auscultation bilaterally Cardio: regular rate and rhythm GI: normal findings: bowel sounds normal and soft, non-tender  Assessment/Plan: Fall Left renal lac -- Urology wants CBC and CT before d/c. Will order. ABL anemia -- Stable Obesity Elevated BP -- Needs f/u as OP EtOH use FEN -- No issues VTE -- SCD's Dispo -- Home if CBC, CT ok     Freeman Caldron, PA-C Pager: [phone] General Trauma PA Pager: [phone]   01/08/2011

## 2011-01-08 NOTE — Progress Notes (Signed)
Urology Progress Note  Subjective:     No acute urologic events overnight. Pain controlled.  Ambulating.  Positive flatus.  Voiding clear yellow urine without difficulty.  Patient complains of left scrotal fullness.  Objective:  Patient Vitals for the past 24 hrs:  BP Temp Temp src Pulse Resp SpO2  01/08/11 0536 175/83 mmHg 97.9 F (36.6 C) - 81  18  98 %  01/08/11 0153 180/85 mmHg 98.1 F (36.7 C) - 89  18  100 %  01/07/11 2247 170/72 mmHg 97.6 F (36.4 C) Oral 82  18  96 %  01/07/11 2010 171/82 mmHg - - 98  - -  01/07/11 2000 171/82 mmHg - - 93  - -  01/07/11 1343 185/81 mmHg 98.6 F (37 C) Oral 89  18  99 %    Physical Exam: General:  No acute distress, awake Cardiovascular:    [x]   S1/S2 present, RRR  []   Irregularly irregular Chest:  CTA-B Abdomen:  Soft, NTTP, no rebound TTP             Genitourinary: No scrotal or penile edema.  Small area of ecchymosis left anterior superior scrotum approximately 1.5 x 1.5 cm.  Testicles descended bilaterally without TTP or mass.  Right epididymis non-TTP; left epididymis mildly TTP. Foley:  None    I/O last 3 completed shifts: In: 840 [P.O.:840] Out: -   Recent Labs  Basename 01/07/11 0500 01/06/11 0501   HGB 9.7* 9.1*   WBC 11.3* 10.9*   PLT 244 221    Recent Labs  Basename 01/07/11 0500 01/06/11 0530   NA 139 140   K 3.7 3.9   CL 106 106   CO2 26 28   BUN 14 13   CREATININE 1.17 1.16   CALCIUM 8.6 8.1*   GFRNONAA 67* 67*   GFRAA 77* 78*     No results found for this basename: PT:2,INR:2,APTT:2 in the last 72 hours   No components found with this basename: ABG:2     Assessment: Left renal injury. Plan: -Would check one more hemoglobin today. -Repeat CT scan abdomen/pelvis with IV contrast prior to discharge home. -Left epididymal tenderness likely due to epididymal congestion; CT will ensure hematoma has not enlarged. -If above tests are stable, OK to discharge home.   -Follow up in 2 weeks with [facility] with NP/PA; patient given my card with contact information.   Natalia Leatherwood, MD [phone]

## 2011-01-08 NOTE — Progress Notes (Signed)
SBP remains elevated.  Educational material given and dangers of untreated HTN discussed.  Pt has many social issues (jobless, possible loss of home, decreased sense of self worth "sometimes I think my kids would be better off if I wasn't around any more").  Will request social work consult to help with medicaid or other assistance.

## 2011-01-08 NOTE — Progress Notes (Signed)
Clinical Social Worker completed the psychosocial assessment which can be found in the shadow chart.  Patient plans to return home.  Patient does have family support to lean on in the event that patient is unable to keep his apartment.  Patient states that his new job will begin the end of January.  Patient did have questions regarding his financial status for his hospital stay - CSW contacted financial counseling to discuss with patient prior to discharge.   Clinical Social Worker has completed SBIRT with patient at bedside.  Patient drinks a few beers while playing golf with his friends on Saturday.  Patient with no concerns regarding current ETOH use.  Clinical Social Worker will sign off for now as social work intervention is no longer needed. Please consult [place] again if new need arises.  [address] [place], [place] [phone]

## 2011-01-08 NOTE — Progress Notes (Signed)
Check CBC and CT per urology Patient examined and I agree with the assessment and plan  Lawsyn Heiler E

## 2011-01-09 NOTE — Discharge Summary (Signed)
[name]  

## 2011-02-19 ENCOUNTER — Encounter (HOSPITAL_COMMUNITY): Payer: Self-pay

## 2011-02-19 ENCOUNTER — Emergency Department (HOSPITAL_COMMUNITY)
Admission: EM | Admit: 2011-02-19 | Discharge: 2011-02-19 | Disposition: A | Discharge status: Home / Self Care | Payer: Self-pay | Attending: Emergency Medicine | Admitting: Emergency Medicine

## 2011-02-19 ENCOUNTER — Emergency Department (HOSPITAL_COMMUNITY): Payer: Self-pay

## 2011-02-19 DIAGNOSIS — R300 Dysuria: Secondary | ICD-10-CM | POA: Insufficient documentation

## 2011-02-19 DIAGNOSIS — R110 Nausea: Secondary | ICD-10-CM | POA: Insufficient documentation

## 2011-02-19 DIAGNOSIS — N200 Calculus of kidney: Secondary | ICD-10-CM | POA: Insufficient documentation

## 2011-02-19 DIAGNOSIS — R109 Unspecified abdominal pain: Secondary | ICD-10-CM | POA: Insufficient documentation

## 2011-02-19 LAB — CBC
MCHC: 32.8 g/dL (ref 30.0–36.0)
Platelets: 298 10*3/uL (ref 150–400)
RDW: 15.1 % (ref 11.5–15.5)
WBC: 13.4 10*3/uL — ABNORMAL HIGH (ref 4.0–10.5)

## 2011-02-19 LAB — POCT I-STAT, CHEM 8
Calcium, Ion: 1.15 mmol/L (ref 1.12–1.32)
Chloride: 108 mEq/L (ref 96–112)
HCT: 43 % (ref 39.0–52.0)
Hemoglobin: 14.6 g/dL (ref 13.0–17.0)
Potassium: 4 mEq/L (ref 3.5–5.1)

## 2011-02-19 LAB — URINALYSIS, ROUTINE W REFLEX MICROSCOPIC
Ketones, ur: 15 mg/dL — AB
Leukocytes, UA: NEGATIVE
Nitrite: NEGATIVE
Specific Gravity, Urine: 1.024 (ref 1.005–1.030)
Urobilinogen, UA: 0.2 mg/dL (ref 0.0–1.0)
pH: 7 (ref 5.0–8.0)

## 2011-02-19 LAB — URINE MICROSCOPIC-ADD ON

## 2011-02-19 MED ORDER — PROMETHAZINE HCL 25 MG RE SUPP: 25.0000 mg | Freq: Four times a day (QID) | RECTAL | Status: AC | PRN

## 2011-02-19 MED ORDER — HYDROMORPHONE HCL PF 1 MG/ML IJ SOLN
INTRAMUSCULAR | Status: AC
  Administered 2011-02-19: 1 mg via INTRAVENOUS
  Filled 2011-02-19: qty 1

## 2011-02-19 MED ORDER — OXYCODONE-ACETAMINOPHEN 5-325 MG PO TABS: 1.0000 | ORAL_TABLET | ORAL | Status: AC | PRN

## 2011-02-19 MED ORDER — SODIUM CHLORIDE 0.9 % IV SOLN
INTRAVENOUS | Status: DC
  Administered 2011-02-19: 15:00:00 via INTRAVENOUS

## 2011-02-19 MED ORDER — PROMETHAZINE HCL 25 MG PO TABS: 25.0000 mg | ORAL_TABLET | Freq: Four times a day (QID) | ORAL | Status: AC | PRN

## 2011-02-19 MED ORDER — IOHEXOL 300 MG/ML  SOLN
100.0000 mL | Freq: Once | INTRAMUSCULAR | Status: AC | PRN
  Administered 2011-02-19: 100 mL via INTRAVENOUS

## 2011-02-19 MED ORDER — HYDROMORPHONE HCL PF 1 MG/ML IJ SOLN
1.0000 mg | Freq: Once | INTRAMUSCULAR | Status: AC
  Administered 2011-02-19: 1 mg via INTRAVENOUS
  Filled 2011-02-19: qty 1

## 2011-02-19 MED ORDER — ONDANSETRON HCL 4 MG/2ML IJ SOLN
4.0000 mg | Freq: Once | INTRAMUSCULAR | Status: AC
  Administered 2011-02-19: 4 mg via INTRAVENOUS

## 2011-02-19 MED ORDER — HYDROMORPHONE HCL PF 1 MG/ML IJ SOLN
1.0000 mg | Freq: Once | INTRAMUSCULAR | Status: AC
Start: 2011-02-19 — End: 2011-02-19
  Administered 2011-02-19: 1 mg via INTRAVENOUS
  Filled 2011-02-19: qty 1

## 2011-02-19 MED ORDER — IOHEXOL 300 MG/ML  SOLN
20.0000 mL | INTRAMUSCULAR | Status: AC
  Administered 2011-02-19: 20 mL via ORAL

## 2011-02-19 MED ORDER — HYDROMORPHONE HCL PF 1 MG/ML IJ SOLN
1.0000 mg | Freq: Once | INTRAMUSCULAR | Status: AC
  Administered 2011-02-19: 1 mg via INTRAVENOUS

## 2011-02-19 MED ORDER — ONDANSETRON HCL 4 MG/2ML IJ SOLN
INTRAMUSCULAR | Status: AC
  Administered 2011-02-19: 4 mg via INTRAVENOUS
  Filled 2011-02-19: qty 2

## 2011-02-19 NOTE — ED Notes (Signed)
Made PA aware of pt's BP

## 2011-02-19 NOTE — ED Provider Notes (Signed)
Medical screening examination/treatment/procedure(s) were conducted as a shared visit with non-physician practitioner(s) and myself.  I personally evaluated the patient during the encounter  Doug Sou, MD 02/19/11 2339

## 2011-02-19 NOTE — ED Notes (Signed)
Lt. Lower abdominal pain into his lt. Flank area began on SAturday and has progressively became worse. Pressure into pain, Hx of lacerated lt. Kidney on December 13th and was admitted to 5 days,.   Unable to tell if his he has hematuria.  Pt. Reports just trickling  urine.  Since last night.

## 2011-02-19 NOTE — ED Notes (Signed)
MD aware of BP

## 2011-02-19 NOTE — ED Provider Notes (Addendum)
History     CSN: [id number]  Arrival date & time 02/19/11  1313   First MD Initiated Contact with Patient 02/19/11 1410      Chief Complaint  Patient presents with  . Flank Pain    (Consider location/radiation/quality/duration/timing/severity/associated sxs/prior treatment) HPI Complains of left upper quadrant pain radiating to left flank and to left groin onset 2 days ago gradual pain waxes and wanes constant worse with certain positions and improved with other positions no treatment prior to coming here. Pain mild at present had slight nausea 2 days ago which resolved. Admits peak urinating small amounts, but feels that he empties his bladder completely. Pain is similar to pain  he had when suffering a kidney injury as result of fall 12/ 2012. Past Medical History  Diagnosis Date  . No pertinent past medical history     Past Surgical History  Procedure Date  . Appendectomy   . Tonsillectomy    knee surgery  History reviewed. No pertinent family history.  History  Substance Use Topics  . Smoking status: Never Smoker   . Smokeless tobacco: Never Used  . Alcohol Use: Yes     occa   No illicit drug use   Review of Systems  Gastrointestinal: Positive for abdominal pain.  Genitourinary: Positive for dysuria and flank pain.  All other systems reviewed and are negative.    Allergies  Review of patient's allergies indicates no known allergies.  Home Medications   Current Outpatient Rx  Name Route Sig Dispense Refill  . ACETAMINOPHEN 500 MG PO TABS Oral Take 1,000 mg by mouth every 6 (six) hours as needed. For pain    . GLUCOSAMINE-CHONDROITIN 500-400 MG PO TABS Oral Take 1 tablet by mouth every morning.      Carma Leaven M PLUS PO TABS Oral Take 1 tablet by mouth daily.        BP 204/100  Pulse 99  Temp(Src) 98.7 F (37.1 C) (Oral)  Resp 20  SpO2 99%  Physical Exam  Nursing note and vitals reviewed. Constitutional: He appears well-developed and well-nourished.    HENT:  Head: Normocephalic and atraumatic.  Eyes: Conjunctivae are normal. Pupils are equal, round, and reactive to light.  Neck: Neck supple. No tracheal deviation present. No thyromegaly present.  Cardiovascular: Normal rate and regular rhythm.   No murmur heard. Pulmonary/Chest: Effort normal and breath sounds normal.  Abdominal: Soft. Bowel sounds are normal. He exhibits no distension and no mass. There is tenderness. There is no rebound and no guarding.       Obese. Mildly tender left upper quadrant no guarding rigidity or rebound  Genitourinary: Penis normal.       Mildly tender left flank  Musculoskeletal: Normal range of motion. He exhibits no edema and no tenderness.  Neurological: He is alert. Coordination normal.  Skin: Skin is warm and dry. No rash noted.  Psychiatric: He has a normal mood and affect.    ED Course  Procedures (including critical care time) Declines pain medicine. Move to CDU waiting diagnostic studies. And reevaluation Labs Reviewed - No data to display No results found. 2:45 PM patient requesting pain medicine. dialudid ordered  No diagnosis found.    MDM  Diagnosis#1 left flank pain #2 elevated blood pressure       Doug Sou, MD 02/19/11 1450  Doug Sou, MD 02/19/11 1450

## 2011-02-19 NOTE — ED Provider Notes (Signed)
I assumed care of this patient in the CDU as a shared visit with Dr. Ethelda Chick. Pt with intermittent L flank pain x 3 days, progressively worsening. Suffered a L kidney lac 2/2 fall on 01/04/11; he was admitted at that time and followed up with urology as an outpatient. They have been monitoring the area of hematoma and told him to make f/u in several months.  Exam significant for L CVA tenderness.  CT shows evidence of L kidney stone at UVJ, 3.5 mm with mod hydroureter. Hematoma appears c/w previous (I personally reviewed the pt's CT). Heme + UA. Creatinine 1.5 which is mildly elevated from Dec values of ~1.2.  Pt's pain was well controlled in the dept. His BP was consistently elevated to 200s systolic. I reviewed his vitals from his hospitalization and he had systolics of 180s at that time; attribute elevated BP to pain. Will provide him with rxes for antiemetics and analgesics. Encouraged pushing fluids for next several days and straining urine. Strongly encouraged urology f/u within the next several days for a recheck and to have BP rechecked. Return precautions discussed. Pt voiced understanding and was agreeable with plan.  Results for orders placed during the hospital encounter of 02/19/11  CBC      Component Value Range   WBC 13.4 (*) 4.0 - 10.5 (K/uL)   RBC 4.82  4.22 - 5.81 (MIL/uL)   Hemoglobin 13.5  13.0 - 17.0 (g/dL)   HCT 16.1  09.6 - 04.5 (%)   MCV 85.3  78.0 - 100.0 (fL)   MCH 28.0  26.0 - 34.0 (pg)   MCHC 32.8  30.0 - 36.0 (g/dL)   RDW 40.9  81.1 - 91.4 (%)   Platelets 298  150 - 400 (K/uL)  POCT I-STAT, CHEM 8      Component Value Range   Sodium 141  135 - 145 (mEq/L)   Potassium 4.0  3.5 - 5.1 (mEq/L)   Chloride 108  96 - 112 (mEq/L)   BUN 19  6 - 23 (mg/dL)   Creatinine, Ser 7.82 (*) 0.50 - 1.35 (mg/dL)   Glucose, Bld 956 (*) 70 - 99 (mg/dL)   Calcium, Ion 2.13  0.86 - 1.32 (mmol/L)   TCO2 25  0 - 100 (mmol/L)   Hemoglobin 14.6  13.0 - 17.0 (g/dL)   HCT 57.8   46.9 - 62.9 (%)  URINALYSIS, ROUTINE W REFLEX MICROSCOPIC      Component Value Range   Color, Urine YELLOW  YELLOW    APPearance CLEAR  CLEAR    Specific Gravity, Urine 1.024  1.005 - 1.030    pH 7.0  5.0 - 8.0    Glucose, UA NEGATIVE  NEGATIVE (mg/dL)   Hgb urine dipstick MODERATE (*) NEGATIVE    Bilirubin Urine NEGATIVE  NEGATIVE    Ketones, ur 15 (*) NEGATIVE (mg/dL)   Protein, ur NEGATIVE  NEGATIVE (mg/dL)   Urobilinogen, UA 0.2  0.0 - 1.0 (mg/dL)   Nitrite NEGATIVE  NEGATIVE    Leukocytes, UA NEGATIVE  NEGATIVE   URINE MICROSCOPIC-ADD ON      Component Value Range   Squamous Epithelial / LPF RARE  RARE    WBC, UA 0-2  <3 (WBC/hpf)   RBC / HPF 11-20  <3 (RBC/hpf)   Ct Abdomen Pelvis W Contrast  02/19/2011  *RADIOLOGY REPORT*  Clinical Data: 61 year old male with left abdominal, flank and pelvic pain.  Patient with history of left renal injury 6 weeks ago with large subcapsular hematoma.  CT ABDOMEN AND PELVIS WITH CONTRAST  Technique:  Multidetector CT imaging of the abdomen and pelvis was performed following the standard protocol during bolus administration of intravenous contrast.  Contrast: OMNIPAQUE IOHEXOL 300 MG/ML IV SOLN  Comparison: 01/08/2011  Findings: Mild bibasilar atelectasis is noted.  A 3.5 mm calculus at the left UVJ is new causing moderate left hydroureteronephrosis.  A large left subcapsular hematoma has decreased in attenuation and is stable in size measuring 6.9 x 8.7 cm in greatest diameter. Mass effect on the posterior left kidney is again identified. Nonobstructing bilateral renal calculi are identified, right greater than left with unchanged right renal cysts.  The liver, spleen, pancreas, adrenal glands and gallbladder unremarkable.  No free fluid, enlarged lymph nodes, biliary dilation or abdominal aortic aneurysm identified. The bowel and bladder are unremarkable otherwise.  No acute or suspicious bony abnormalities are present. Degenerative changes of the  hips and lumbar spine are present.  IMPRESSION: New 3.5 mm left UVJ calculus causing moderate left hydroureteronephrosis.  Stable sized large left subcapsular hematoma with evolutionary changes and continued mass effect on the left kidney.  Small nonobstructing bilateral renal calculi.  Original Report Authenticated By: Rosendo Gros, M.D.      Grant Fontana, [place] 02/19/11 1928  Grant [place], [place] 02/19/11 [phone]

## 2012-05-20 IMAGING — CT CT ABD-PELV W/ CM
2 of 5 series · 14 of 32 positions shown, 19 images · IV contrast (water/omni  & 100ml omni 300)
Comparison: 01/08/2011

CLINICAL DATA: 60-year-old male with left abdominal, flank and
pelvic pain.  Patient with history of left renal injury 6 weeks ago
with large subcapsular hematoma.

CT ABDOMEN AND PELVIS WITH CONTRAST
TECHNIQUE: Multidetector CT imaging of the abdomen and pelvis was
performed following the standard protocol during bolus
administration of intravenous contrast.
Contrast: 100mL OMNIPAQUE IOHEXOL 300 MG/ML IV SOLN

[Series 2: routine abdomen · axial · 0.84mm/px · z∈[-455,-95]mm · 6 of 102 slices shown, 11 images]
[im 15/102  soft-tissue]
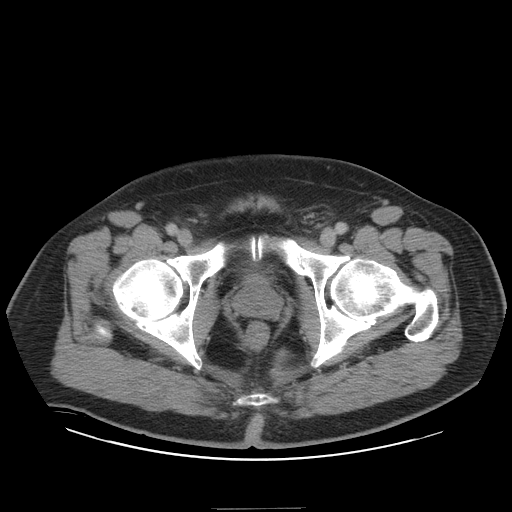
[im 15/102  bone]
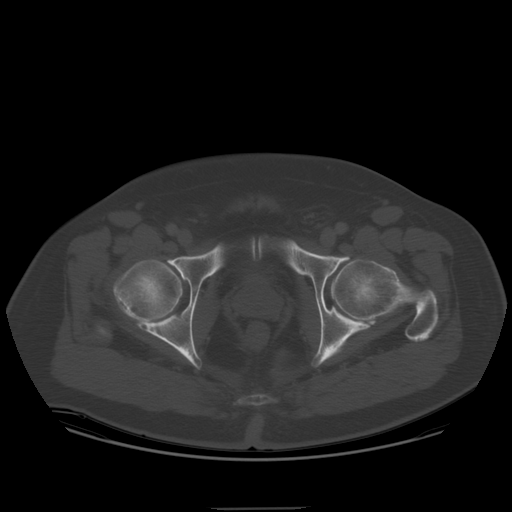
[im 29/102  soft-tissue]
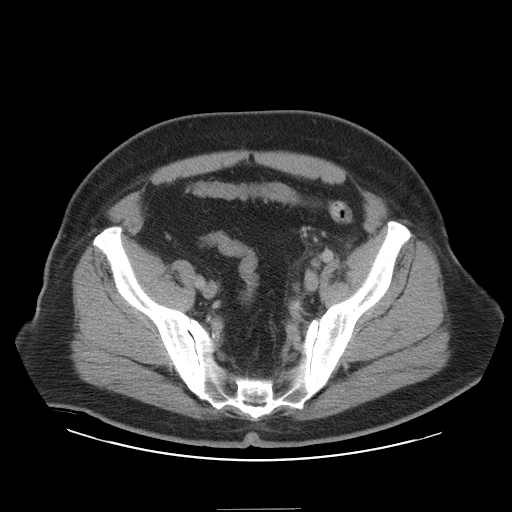
[im 44/102  soft-tissue]
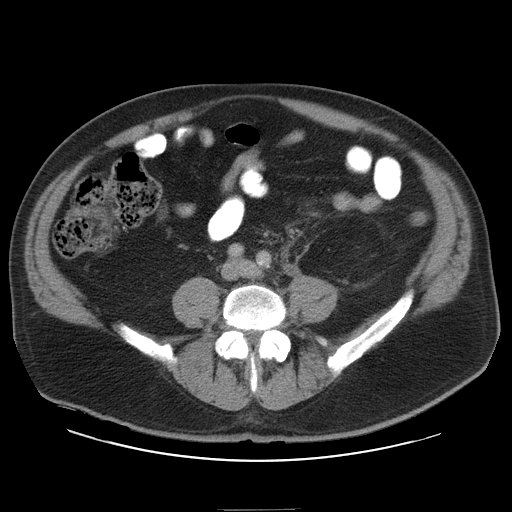
[im 44/102  lung]
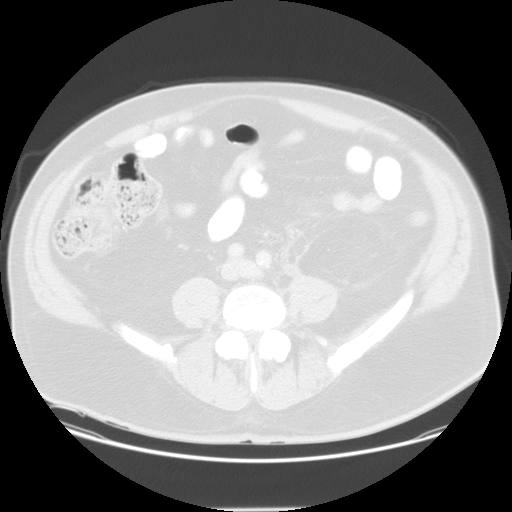
[im 58/102  soft-tissue]
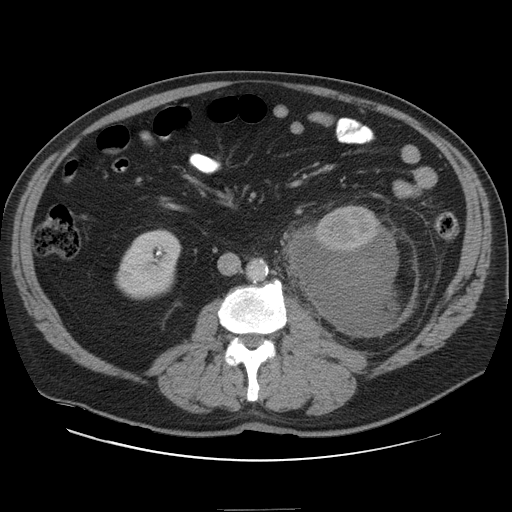
[im 58/102  lung]
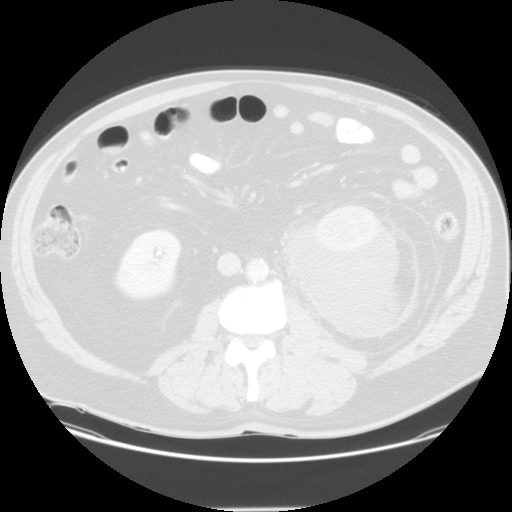
[im 73/102  soft-tissue]
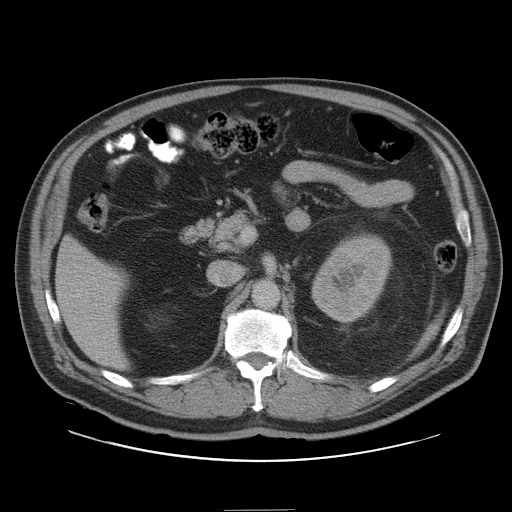
[im 73/102  lung]
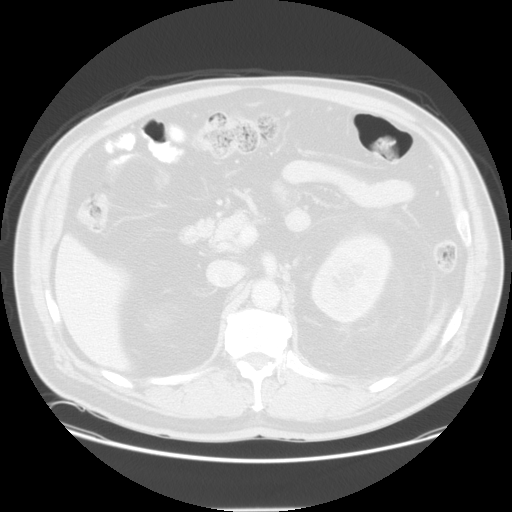
[im 87/102  soft-tissue]
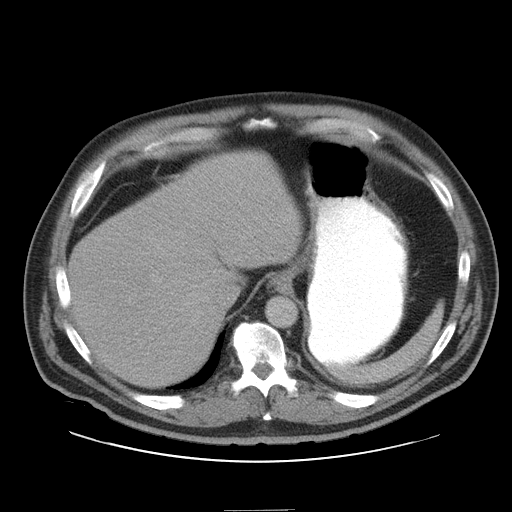
[im 87/102  lung]
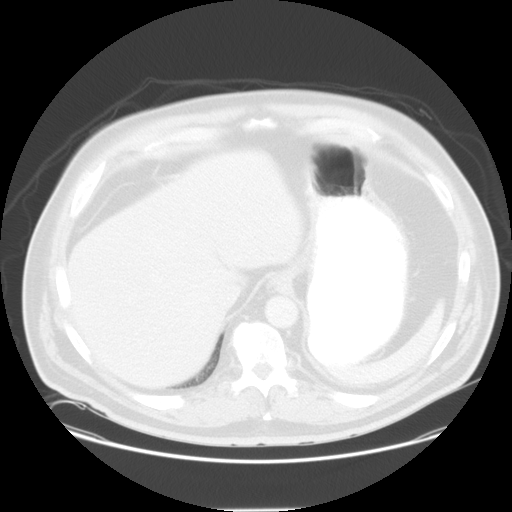

[Series 401: sagittals · sagittal · 1.01mm/px · 8 of 137 slices shown]
[im 14/137  soft-tissue]
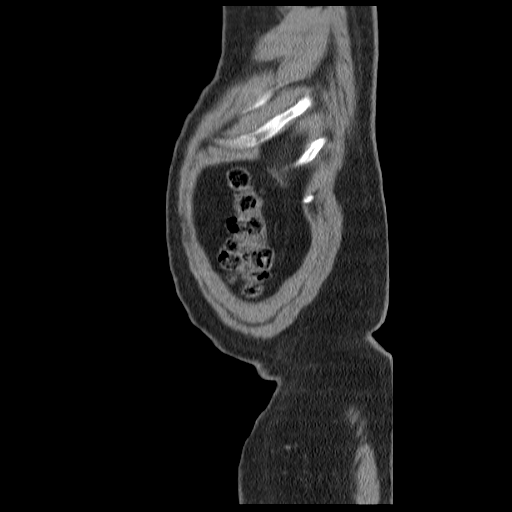
[im 28/137  soft-tissue]
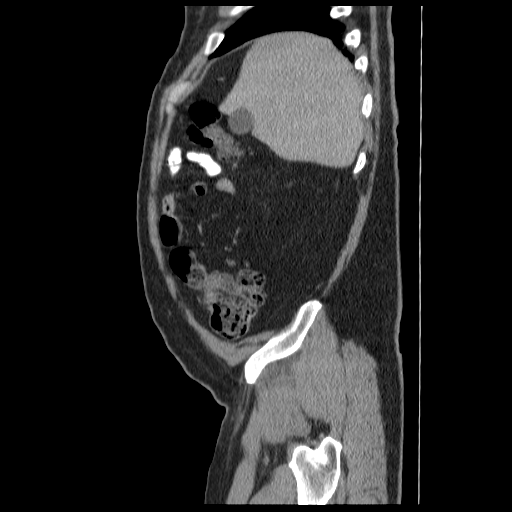
[im 41/137  soft-tissue]
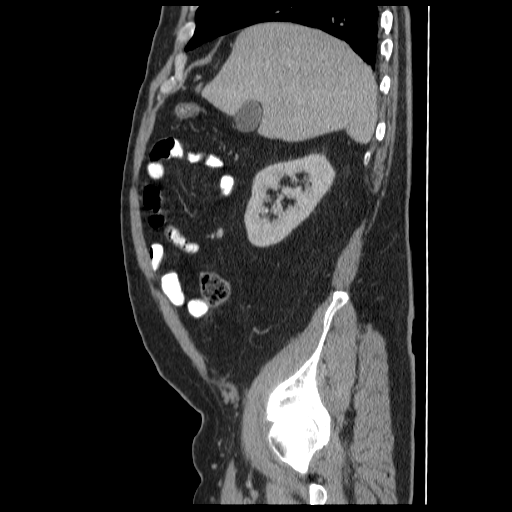
[im 55/137  soft-tissue]
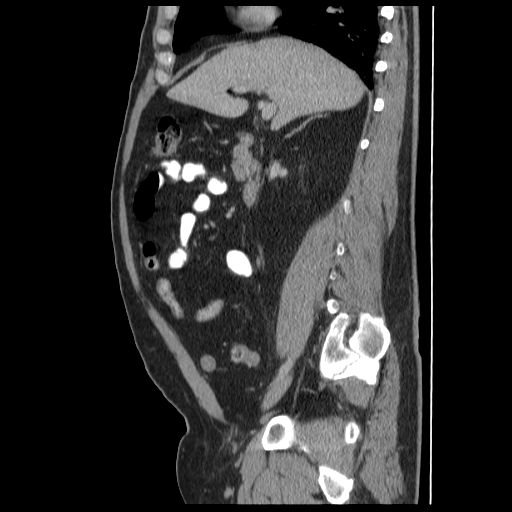
[im 82/137  soft-tissue]
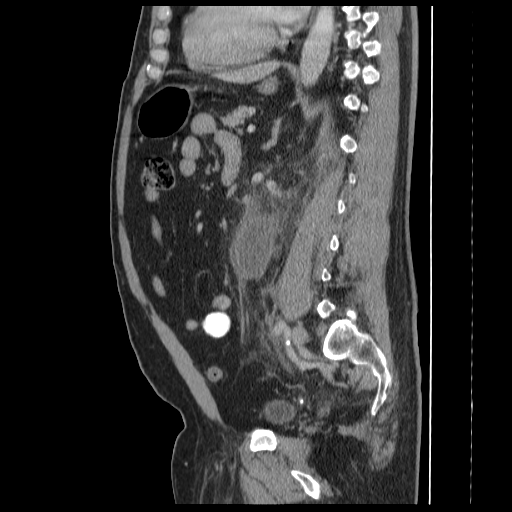
[im 96/137  soft-tissue]
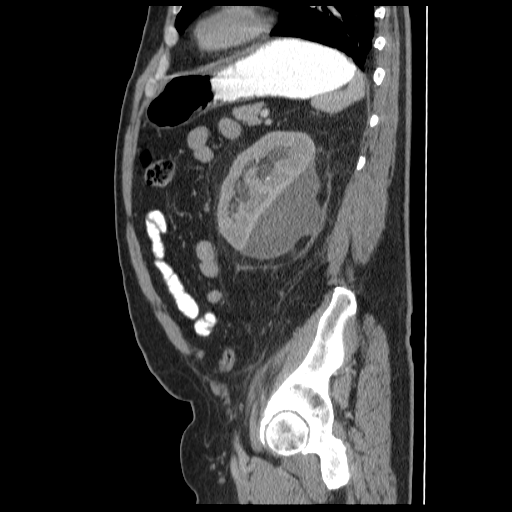
[im 109/137  soft-tissue]
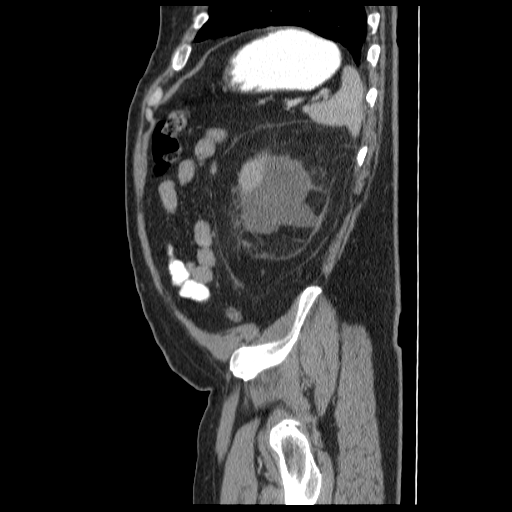
[im 123/137  soft-tissue]
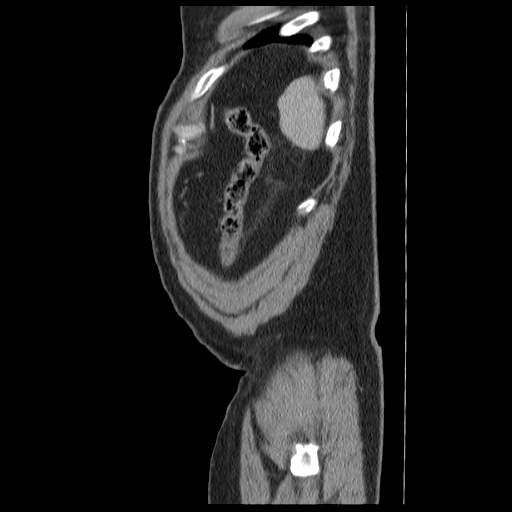

[14 of 32 positions shown; findings below may reference images not displayed]

FINDINGS: Mild bibasilar atelectasis is noted.

A 3.5 mm calculus at the left UVJ is new causing moderate left
hydroureteronephrosis.

A large left subcapsular hematoma has decreased in attenuation and
is stable in size measuring 6.9 x 8.7 cm in greatest diameter.
Mass effect on the posterior left kidney is again identified.
Nonobstructing bilateral renal calculi are identified, right
greater than left with unchanged right renal cysts.

The liver, spleen, pancreas, adrenal glands and gallbladder
unremarkable.

No free fluid, enlarged lymph nodes, biliary dilation or abdominal
aortic aneurysm identified.
The bowel and bladder are unremarkable otherwise.

No acute or suspicious bony abnormalities are present.
Degenerative changes of the hips and lumbar spine are present.
IMPRESSION: New 3.5 mm left UVJ calculus causing moderate left
hydroureteronephrosis.

Stable sized large left subcapsular hematoma with evolutionary
changes and continued mass effect on the left kidney.

Small nonobstructing bilateral renal calculi.

## 2018-11-06 ENCOUNTER — Other Ambulatory Visit: Payer: Self-pay | Admitting: Urology

## 2018-11-22 ENCOUNTER — Other Ambulatory Visit (HOSPITAL_COMMUNITY)
Admission: RE | Admit: 2018-11-22 | Discharge: 2018-11-22 | Disposition: A | Discharge status: Home / Self Care | Payer: BC Managed Care – PPO | Source: Ambulatory Visit | Attending: Urology | Admitting: Urology

## 2018-11-22 DIAGNOSIS — Z20828 Contact with and (suspected) exposure to other viral communicable diseases: Secondary | ICD-10-CM | POA: Diagnosis not present

## 2018-11-22 DIAGNOSIS — Z01812 Encounter for preprocedural laboratory examination: Secondary | ICD-10-CM | POA: Diagnosis not present

## 2018-11-23 LAB — NOVEL CORONAVIRUS, NAA (HOSP ORDER, SEND-OUT TO REF LAB; TAT 18-24 HRS): SARS-CoV-2, NAA: NOT DETECTED

## 2018-11-25 ENCOUNTER — Encounter (HOSPITAL_BASED_OUTPATIENT_CLINIC_OR_DEPARTMENT_OTHER): Payer: Self-pay

## 2018-11-25 ENCOUNTER — Other Ambulatory Visit: Payer: Self-pay

## 2018-11-25 NOTE — Progress Notes (Signed)
SPOKE W/  Jaleel     SCREENING SYMPTOMS OF COVID 19:   COUGH--NO  RUNNY NOSE---NO   SORE THROAT---NO  NASAL CONGESTION----NO  SNEEZING----NO  SHORTNESS OF BREATH---NO  DIFFICULTY BREATHING---NO  TEMP >100.0 -----NO  UNEXPLAINED BODY ACHES------NO  CHILLS -------- NO  HEADACHES ---------NO  LOSS OF SMELL/ TASTE --------NO    HAVE YOU OR ANY FAMILY MEMBER TRAVELLED PAST 14 DAYS OUT OF THE   COUNTY---NO STATE----NO COUNTRY----NO  HAVE YOU OR ANY FAMILY MEMBER BEEN EXPOSED TO ANYONE WITH COVID 19? NO    

## 2018-11-25 NOTE — Progress Notes (Signed)
Spoke with: Elenore Rota NPO:  After Midnight, no gum, candy, or mints   Arrival time:  1130 AM Labs: Istat 8, EKG AM medications: Amlodipine Pre op orders: Yes Ride home: Ranae Pila (wife) only speaks [place]

## 2018-11-26 ENCOUNTER — Encounter (HOSPITAL_BASED_OUTPATIENT_CLINIC_OR_DEPARTMENT_OTHER): Payer: Self-pay

## 2018-11-26 ENCOUNTER — Encounter (HOSPITAL_BASED_OUTPATIENT_CLINIC_OR_DEPARTMENT_OTHER)
Admission: RE | Disposition: A | Discharge status: Home / Self Care | Payer: Self-pay | Source: Home / Self Care | Attending: Urology

## 2018-11-26 ENCOUNTER — Ambulatory Visit (HOSPITAL_COMMUNITY): Payer: BC Managed Care – PPO | Admitting: Certified Registered"

## 2018-11-26 ENCOUNTER — Ambulatory Visit (HOSPITAL_BASED_OUTPATIENT_CLINIC_OR_DEPARTMENT_OTHER): Payer: BC Managed Care – PPO

## 2018-11-26 ENCOUNTER — Ambulatory Visit (HOSPITAL_BASED_OUTPATIENT_CLINIC_OR_DEPARTMENT_OTHER)
Admission: RE | Admit: 2018-11-26 | Discharge: 2018-11-26 | Disposition: A | Discharge status: Home / Self Care | Payer: BC Managed Care – PPO | Attending: Urology | Admitting: Urology

## 2018-11-26 ENCOUNTER — Other Ambulatory Visit (HOSPITAL_COMMUNITY): Payer: Self-pay | Admitting: Physician Assistant

## 2018-11-26 ENCOUNTER — Ambulatory Visit (HOSPITAL_COMMUNITY)
Admission: RE | Admit: 2018-11-26 | Discharge: 2018-11-26 | Disposition: A | Discharge status: Home / Self Care | Payer: BC Managed Care – PPO | Source: Ambulatory Visit | Attending: Physician Assistant | Admitting: Physician Assistant

## 2018-11-26 DIAGNOSIS — N189 Chronic kidney disease, unspecified: Secondary | ICD-10-CM | POA: Insufficient documentation

## 2018-11-26 DIAGNOSIS — M7989 Other specified soft tissue disorders: Secondary | ICD-10-CM | POA: Diagnosis not present

## 2018-11-26 DIAGNOSIS — M25571 Pain in right ankle and joints of right foot: Secondary | ICD-10-CM

## 2018-11-26 DIAGNOSIS — N210 Calculus in bladder: Secondary | ICD-10-CM | POA: Diagnosis not present

## 2018-11-26 DIAGNOSIS — N200 Calculus of kidney: Secondary | ICD-10-CM | POA: Diagnosis present

## 2018-11-26 DIAGNOSIS — R609 Edema, unspecified: Secondary | ICD-10-CM | POA: Diagnosis not present

## 2018-11-26 DIAGNOSIS — Z538 Procedure and treatment not carried out for other reasons: Secondary | ICD-10-CM | POA: Insufficient documentation

## 2018-11-26 DIAGNOSIS — I129 Hypertensive chronic kidney disease with stage 1 through stage 4 chronic kidney disease, or unspecified chronic kidney disease: Secondary | ICD-10-CM | POA: Diagnosis not present

## 2018-11-26 DIAGNOSIS — N281 Cyst of kidney, acquired: Secondary | ICD-10-CM | POA: Insufficient documentation

## 2018-11-26 HISTORY — DX: Frequency of micturition: R35.0

## 2018-11-26 HISTORY — DX: Other specified postprocedural states: R11.2

## 2018-11-26 HISTORY — DX: Presence of spectacles and contact lenses: Z97.3

## 2018-11-26 HISTORY — DX: Hyperlipidemia, unspecified: E78.5

## 2018-11-26 HISTORY — DX: Unspecified osteoarthritis, unspecified site: M19.90

## 2018-11-26 HISTORY — DX: Other specified postprocedural states: Z98.890

## 2018-11-26 HISTORY — DX: Essential (primary) hypertension: I10

## 2018-11-26 HISTORY — DX: Personal history of colonic polyps: Z86.010

## 2018-11-26 HISTORY — DX: Cardiac murmur, unspecified: R01.1

## 2018-11-26 HISTORY — DX: Personal history of urinary calculi: Z87.442

## 2018-11-26 LAB — POCT I-STAT, CHEM 8
BUN: 22 mg/dL (ref 8–23)
Calcium, Ion: 1.23 mmol/L (ref 1.15–1.40)
Chloride: 102 mmol/L (ref 98–111)
Creatinine, Ser: 1.4 mg/dL — ABNORMAL HIGH (ref 0.61–1.24)
Glucose, Bld: 112 mg/dL — ABNORMAL HIGH (ref 70–99)
HCT: 46 % (ref 39.0–52.0)
Hemoglobin: 15.6 g/dL (ref 13.0–17.0)
Potassium: 3.5 mmol/L (ref 3.5–5.1)
Sodium: 141 mmol/L (ref 135–145)
TCO2: 22 mmol/L (ref 22–32)

## 2018-11-26 SURGERY — CYSTOSCOPY, WITH BLADDER CALCULUS LITHOLAPAXY
Anesthesia: General | Laterality: Bilateral

## 2018-11-26 MED ORDER — OXYCODONE-ACETAMINOPHEN 5-325 MG PO TABS
1.0000 | ORAL_TABLET | Freq: Once | ORAL | Status: AC
  Administered 2018-11-26: 1 via ORAL
  Filled 2018-11-26: qty 1

## 2018-11-26 MED ORDER — FENTANYL CITRATE (PF) 100 MCG/2ML IJ SOLN
INTRAMUSCULAR | Status: AC
  Filled 2018-11-26: qty 2

## 2018-11-26 MED ORDER — OXYCODONE-ACETAMINOPHEN 5-325 MG PO TABS
ORAL_TABLET | ORAL | Status: AC
  Filled 2018-11-26: qty 1

## 2018-11-26 MED ORDER — LACTATED RINGERS IV SOLN
INTRAVENOUS | Status: DC
  Administered 2018-11-26: 12:00:00 via INTRAVENOUS
  Filled 2018-11-26: qty 1000

## 2018-11-26 MED ORDER — CEFAZOLIN SODIUM-DEXTROSE 2-4 GM/100ML-% IV SOLN
2.0000 g | Freq: Once | INTRAVENOUS | Status: DC
  Filled 2018-11-26: qty 100

## 2018-11-26 MED ORDER — CEFAZOLIN SODIUM-DEXTROSE 2-4 GM/100ML-% IV SOLN
INTRAVENOUS | Status: AC
  Filled 2018-11-26: qty 100

## 2018-11-26 MED ORDER — PROPOFOL 10 MG/ML IV BOLUS
INTRAVENOUS | Status: AC
  Filled 2018-11-26: qty 20

## 2018-11-26 MED ORDER — MIDAZOLAM HCL 2 MG/2ML IJ SOLN
INTRAMUSCULAR | Status: AC
  Filled 2018-11-26: qty 2

## 2018-11-26 SURGICAL SUPPLY — 25 items
BAG DRAIN URO-CYSTO SKYTR STRL (DRAIN) IMPLANT
BASKET STONE 1.7 NGAGE (UROLOGICAL SUPPLIES) IMPLANT
BASKET ZERO TIP NITINOL 2.4FR (BASKET) IMPLANT
BENZOIN TINCTURE PRP APPL 2/3 (GAUZE/BANDAGES/DRESSINGS) IMPLANT
CATH URET 5FR 28IN OPEN ENDED (CATHETERS) IMPLANT
CLOSURE WOUND 1/2 X4 (GAUZE/BANDAGES/DRESSINGS)
CLOTH BEACON ORANGE TIMEOUT ST (SAFETY) IMPLANT
FIBER LASER FLEXIVA 365 (UROLOGICAL SUPPLIES) IMPLANT
FIBER LASER TRAC TIP (UROLOGICAL SUPPLIES) IMPLANT
GLOVE BIO SURGEON STRL SZ7.5 (GLOVE) IMPLANT
GOWN STRL REUS W/TWL XL LVL3 (GOWN DISPOSABLE) IMPLANT
GUIDEWIRE STR DUAL SENSOR (WIRE) IMPLANT
GUIDEWIRE ZIPWRE .038 STRAIGHT (WIRE) IMPLANT
IV NS 1000ML (IV SOLUTION)
IV NS 1000ML BAXH (IV SOLUTION) IMPLANT
IV NS IRRIG 3000ML ARTHROMATIC (IV SOLUTION) IMPLANT
KIT TURNOVER CYSTO (KITS) IMPLANT
MANIFOLD NEPTUNE II (INSTRUMENTS) IMPLANT
NS IRRIG 500ML POUR BTL (IV SOLUTION) IMPLANT
PACK CYSTO (CUSTOM PROCEDURE TRAY) IMPLANT
STRIP CLOSURE SKIN 1/2X4 (GAUZE/BANDAGES/DRESSINGS) IMPLANT
SYR 10ML LL (SYRINGE) IMPLANT
TUBE CONNECTING 12'X1/4 (SUCTIONS)
TUBE CONNECTING 12X1/4 (SUCTIONS) IMPLANT
TUBING UROLOGY SET (TUBING) IMPLANT

## 2018-11-26 NOTE — Progress Notes (Signed)
Right lower extremity venous duplex has been completed. Preliminary results can be found in CV Proc through chart review.  Results were given to the patient's nurse, Sharyn Lull.   11/26/18 1:55 PM Alexander Lynn RVT

## 2018-11-26 NOTE — Anesthesia Preprocedure Evaluation (Deleted)
Anesthesia Evaluation  Patient identified by MRN, date of birth, ID band Patient awake    Reviewed: Allergy & Precautions, NPO status , Patient's Chart, lab work & pertinent test results  History of Anesthesia Complications (+) PONV  Airway Mallampati: II  TM Distance: >3 FB Neck ROM: Full    Dental   Pulmonary    Pulmonary exam normal        Cardiovascular hypertension, Pt. on medications Normal cardiovascular exam     Neuro/Psych    GI/Hepatic   Endo/Other    Renal/GU      Musculoskeletal   Abdominal   Peds  Hematology   Anesthesia Other Findings   Reproductive/Obstetrics                             Anesthesia Physical Anesthesia Plan  ASA: II  Anesthesia Plan: General   Post-op Pain Management:    Induction: Intravenous  PONV Risk Score and Plan: 3 and Ondansetron and Midazolam  Airway Management Planned: LMA  Additional Equipment:   Intra-op Plan:   Post-operative Plan: Extubation in OR  Informed Consent: I have reviewed the patients History and Physical, chart, labs and discussed the procedure including the risks, benefits and alternatives for the proposed anesthesia with the patient or authorized representative who has indicated his/her understanding and acceptance.       Plan Discussed with: CRNA and Surgeon  Anesthesia Plan Comments:         Anesthesia Quick Evaluation

## 2018-11-26 NOTE — H&P (Addendum)
Urology Preoperative H&P   Chief Complaint: Kidney and bladder stones  History of Present Illness: Alexander Lynn is a 68 y.o. male with a long history of kidney stones.  He presented to clinic on 10/27/18 after passing a large stone.  Subsequent CTSS revealed bilateral renal stones measuring up to 8 mm and a 2.7 cm bladder stone.  He denies flank pain, fever, chills, dysuria or hematuria.  He is here today for definitive stone treatment.    As a side note, the patient reports severe right ankle pain today.  He notes that he was raking leaves over the weekend and awoke on Saturday with diffuse ankle swelling and pain that is progressively worsened.  He cannot recollect a specific traumatic event that led to this pain. Past Medical History:  Diagnosis Date  . Arthritis   . Chronic kidney disease   . Heart murmur   . History of colon polyps   . History of kidney stones   . Hyperlipidemia   . Hypertension   . Kidney laceration   . No pertinent past medical history   . Obese   . PONV (postoperative nausea and vomiting)    with dental procedures  . 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 Urinary frequency   . Wears glasses     Past Surgical History:  Procedure Laterality Date  . APPENDECTOMY    . CERVICAL FUSION    . COLONOSCOPY    . KNEE SURGERY Left   . TONSILLECTOMY    . WISDOM TOOTH EXTRACTION      Allergies: No Known Allergies  History reviewed. No pertinent family history.  Social History:  reports that he has never smoked. He has never used smokeless tobacco. He reports current alcohol use of about 2.0 standard drinks of alcohol per week. He reports that he does not use drugs.  ROS: A complete review of systems was performed.  All systems are negative except for pertinent findings as noted.  Physical Exam:  Vital signs in last 24 hours: Weight:  [88.5 kg] 88.5 kg (11/03 1214) Constitutional:  Alert and oriented, No acute  distress Cardiovascular: Regular rate and rhythm, No JVD Respiratory: Normal respiratory effort, Lungs clear bilaterally GI: Abdomen is soft, nontender, nondistended, no abdominal masses GU: No CVA tenderness Lymphatic: No lymphadenopathy Neurologic: Grossly intact, no focal deficits Psychiatric: Normal mood and affect Extremities: The right ankle is diffusely swollen and tender to palpation.  He has limited range of motion in the lateral aspects of the malleolus is slightly erythematous. Laboratory Data:  No results for input(s): WBC, HGB, HCT, PLT in the last 72 hours.  No results for input(s): NA, K, CL, GLUCOSE, BUN, CALCIUM, CREATININE in the last 72 hours.  Invalid input(s): CO3   No results found for this or any previous visit (from the past 24 hour(s)). Recent Results (from the past 240 hour(s))  Novel Coronavirus, NAA (Hosp order, Send-out to Ref Lab; TAT 18-24 hrs     Status: None   Collection Time: 11/22/18  6:01 PM   Specimen: Nasopharyngeal Swab; Respiratory  Result Value Ref Range Status   SARS-CoV-2, NAA NOT DETECTED NOT DETECTED Final    Comment: (NOTE) This nucleic acid amplification test was developed and its performance characteristics determined by [employer]. Nucleic acid amplification tests include PCR and TMA. This test has not been FDA cleared or approved. This test has been authorized by FDA under an Emergency Use  Authorization (EUA). This test is only authorized for the duration of time the declaration that circumstances exist justifying the authorization of the emergency use of in vitro diagnostic tests for detection of SARS-CoV-2 virus and/or diagnosis of COVID-19 infection under section 564(b)(1) of the Act, 21 U.S.C. 109UEA-5(W) (1), unless the authorization is terminated or revoked sooner. When diagnostic testing is negative, the possibility of a false negative result should be considered in the context of a patient's recent exposures and  the presence of clinical signs and symptoms consistent with COVID-19. An individual without symptoms of COVID- 19 and who is not shedding SARS-CoV-2 vi rus would expect to have a negative (not detected) result in this assay. Performed At: [facility] [address] [place], [place] [id number] Rush Farmer MD WG:[id number]    [place]  Final    Comment: Performed at [facility], [place] [address] [place], [place] [address]    Renal Function: No results for input(s): CREATININE in the last 168 hours. CrCl cannot be calculated (Patient's most recent lab result is older than the maximum 21 days allowed.).  Radiologic Imaging: No results found. CLINICAL DATA:  Renal calculus     EXAM:  CT ABDOMEN AND PELVIS WITHOUT CONTRAST     TECHNIQUE:  Multidetector CT imaging of the abdomen and pelvis was performed  following the standard protocol without IV contrast.     COMPARISON:  06/08/2015     FINDINGS:  Lower chest: Lung bases are clear.     Hepatobiliary: Unenhanced liver is unremarkable.     Layering gallbladder sludge versus noncalcified gallstones (series  2/image 19). No intrahepatic or extrahepatic ductal dilatation.     Pancreas: Within normal limits.     Spleen: Within normal limits.     Adrenals/Urinary Tract: Adrenal glands are within normal limits.     Rim calcified fatty lesion along the posterior left lower kidney  (series 2/image 32) reflects sequela of prior perirenal hematoma.     Bilateral renal cysts, measuring up to 2.2 cm on the right (series  2/image 27) and 1.5 cm on the left (series 2/image 21), unchanged.     Six nonobstructing left renal calculi, measuring up to 8 mm in the  left lower pole (series 2/image 32). Additional 2 mm nonobstructing  right lower pole renal calculus (series 2/image 33). No ureteral  calculi or hydronephrosis.     Layering 1.5 x 2.7 cm bladder calculus (series 2/image 68),   previously 7 mm.     Stomach/Bowel: Stomach is within normal limits.     No evidence of bowel obstruction.     Appendix is not discretely visualized.     Vascular/Lymphatic: No evidence of abdominal aortic aneurysm.     Atherosclerotic calcifications of the abdominal aorta and branch  vessels.     No suspicious abdominopelvic lymphadenopathy.     Reproductive: Prostate is unremarkable.     Other: No abdominopelvic ascites.     Moderate left inguinal/scrotal hernia containing fat and a loop of  sigmoid colon (series 2/image 86). No associated fluid/stranding.     Musculoskeletal: Degenerative changes of the visualized  thoracolumbar spine.     IMPRESSION:  Multiple nonobstructing bilateral renal calculi measuring up to 8 mm  in the left lower pole. No ureteral calculi or hydronephrosis.     Layering 2.7 cm bladder calculus, previously 7 mm.     Bilateral renal cysts, measuring up to 2.2 cm on the right. Sequela  of prior left  perirenal hematoma.     Moderate left inguinal hernia containing fat and nondilated sigmoid  colon. No evidence of bowel obstruction.        Electronically Signed    By: Charline BillsSriyesh  Krishnan M.D.    On: 10/27/2018 16:28   Assessment and Plan Alexander Lynn is a 68 y.o. male with bilateral renal stones and a 2.7 cm bladder stone  Given the acuity of his right lower extremity swelling along with no specific infectious or traumatic etiology, I am going to obtain a lower extremity ultrasound to rule out a DVT as the source.  He also has an appointment for a right LE x-ray later today.  His surgery will be postponed until otherwise specified.  Rhoderick Moodyhristopher Shanique Aslinger, MD 11/26/2018, 7:11 AM  [facility] Pager: [phone]

## 2018-11-26 NOTE — Progress Notes (Addendum)
Jil,l in Vascular lab at [facility], called to confirm that they will come to patient's room to do vascular US.  Greg from vascular US here at [facility] will come to do vascular US as soon as possible. Patient's wife notified by patient's text of procedure being cancelled and [place] to be obtained.  I checked with patient to see if she had any questions and she said "no questions. Just how long".  Informed her that they are getting started on the [place] now, will probably be about 20 minute, and will update her.

## 2018-12-02 ENCOUNTER — Other Ambulatory Visit: Payer: Self-pay | Admitting: Urology

## 2018-12-04 ENCOUNTER — Encounter (HOSPITAL_BASED_OUTPATIENT_CLINIC_OR_DEPARTMENT_OTHER): Payer: Self-pay | Admitting: *Deleted

## 2018-12-04 ENCOUNTER — Other Ambulatory Visit: Payer: Self-pay

## 2018-12-04 NOTE — Progress Notes (Signed)
Spoke w/ via phone for pre-op interview--- PT Lab needs dos---- Istat 8              Lab results------  Current EKG in chart/ epic COVID test ------  12-05-2018 Arrive at ------- 1000 NPO after ------ [place] Medications to take morning of surgery ----- Norvasc w/ sips of water Diabetic medication ----- n/a Patient Special Instructions ----- n/a  Pre-Op special Istructions -----  This pt rescheduled for 11-26-2018 on dos due to right lower extremity swelling/ redness.  Dr Gloriann Loan ordered doppler which was negative for dvt and foot xray which was normal.  Pt stated dx with gout ,  he was given prednisone and last dose 12-06-2018,  Has resolved. Pt's previous signed consent is with current chart.   Patient verbalized understanding of instructions that were given at this phone interview. Patient denies shortness of breath, chest pain, fever, cough a this phone interview.

## 2018-12-05 ENCOUNTER — Other Ambulatory Visit (HOSPITAL_COMMUNITY)
Admission: RE | Admit: 2018-12-05 | Discharge: 2018-12-05 | Disposition: A | Discharge status: Home / Self Care | Payer: BC Managed Care – PPO | Source: Ambulatory Visit | Attending: Urology | Admitting: Urology

## 2018-12-05 DIAGNOSIS — Z20828 Contact with and (suspected) exposure to other viral communicable diseases: Secondary | ICD-10-CM | POA: Diagnosis not present

## 2018-12-05 DIAGNOSIS — Z01812 Encounter for preprocedural laboratory examination: Secondary | ICD-10-CM | POA: Insufficient documentation

## 2018-12-07 LAB — NOVEL CORONAVIRUS, NAA (HOSP ORDER, SEND-OUT TO REF LAB; TAT 18-24 HRS): SARS-CoV-2, NAA: NOT DETECTED

## 2018-12-08 NOTE — H&P (Signed)
Urology Preoperative H&P   Chief Complaint: Kidney and bladder stones  History of Present Illness: Alexander Lynn is a 68 y.o. male with a long history of kidney stones.  He presented to the clinic on 10/27/2018 after passing a large stone.  A subsequent CT stone study showed multiple bilateral renal stones along with a 2.7 cm bladder stone.  He was initially scheduled for bilateral ureteroscopy and cystolitholapaxy on 11/26/2018, but had an acute onset of right lower extremity pain and swelling that necessitated a lower extremity duplex ultrasound to rule out a DVT, which was ultimately negative.  He was subsequently found to have a gout flare and has completed a course of prednisone with resolution of his symptoms.  Past Medical History:  Diagnosis Date  . Arthritis   . Bladder stone   . Heart murmur   . History of colon polyps   . History of gout    one episode 11-26-2018 right foot,  completed prednisone 12-06-2018,  (12-04-2018 per pt resolved)  . History of kidney injury    01-04-2011 kidney laceration w/ hematoma due to trauma from a fall--- per pt resolved  . History of kidney stones   . 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 Hyperlipidemia   . Hypertension   . PONV (postoperative nausea and vomiting)    with dental procedures  . Renal calculi    bilateral   . Urinary frequency   . Wears glasses     Past Surgical History:  Procedure Laterality Date  . APPENDECTOMY    . CERVICAL FUSION    . COLONOSCOPY    . KNEE SURGERY Left   . TONSILLECTOMY    . WISDOM TOOTH EXTRACTION      Allergies: No Known Allergies  History reviewed. No pertinent family history.  Social History:  reports that he has never smoked. He has never used smokeless tobacco. He reports current alcohol use of about 2.0 standard drinks of alcohol per week. He reports that he does not use drugs.  ROS: A complete review of systems was  performed.  All systems are negative except for pertinent findings as noted.  Physical Exam:  Vital signs in last 24 hours:   Constitutional:  Alert and oriented, No acute distress Cardiovascular: Regular rate and rhythm, No JVD Respiratory: Normal respiratory effort, Lungs clear bilaterally GI: Abdomen is soft, nontender, nondistended, no abdominal masses GU: No CVA tenderness Lymphatic: No lymphadenopathy Neurologic: Grossly intact, no focal deficits Psychiatric: Normal mood and affect  Laboratory Data:  No results for input(s): WBC, HGB, HCT, PLT in the last 72 hours.  No results for input(s): NA, K, CL, GLUCOSE, BUN, CALCIUM, CREATININE in the last 72 hours.  Invalid input(s): CO3   No results found for this or any previous visit (from the past 24 hour(s)). Recent Results (from the past 240 hour(s))  Novel Coronavirus, NAA (Hosp order, Send-out to Ref Lab; TAT 18-24 hrs     Status: None   Collection Time: 12/05/18  3:16 PM   Specimen: Nasopharyngeal Swab; Respiratory  Result Value Ref Range Status   SARS-CoV-2, NAA NOT DETECTED NOT DETECTED Final    Comment: (NOTE) This nucleic acid amplification test was developed and its performance characteristics determined by [employer]. Nucleic acid amplification tests include PCR and TMA. This test has not been FDA cleared or approved. This test has been authorized by FDA under an  Emergency Use Authorization (EUA). This test is only authorized for the duration of time the declaration that circumstances exist justifying the authorization of the emergency use of in vitro diagnostic tests for detection of SARS-CoV-2 virus and/or diagnosis of COVID-19 infection under section 564(b)(1) of the Act, 21 U.S.C. 924QAS-3(M) (1), unless the authorization is terminated or revoked sooner. When diagnostic testing is negative, the possibility of a false negative result should be considered in the context of a patient's recent  exposures and the presence of clinical signs and symptoms consistent with COVID-19. An individual without symptoms of COVID- 19 and who is not shedding SARS-CoV-2 vi rus would expect to have a negative (not detected) result in this assay. Performed At: [facility] [address] [place], [place] [id number] Rush Farmer MD GX:[id number]    [place]  Final    Comment: Performed at [facility], [place] [address] [place], [place] [address]    Renal Function: No results for input(s): CREATININE in the last 168 hours. Estimated Creatinine Clearance: 55.9 mL/min (A) (by C-G formula based on SCr of 1.4 mg/dL (H)).  Radiologic Imaging: No results found.  I independently reviewed the above imaging studies.  Assessment and Plan Alexander Lynn is a 68 y.o. male with bilateral renal stones and a 2.7 cm bladder stone   The risks, benefits and alternatives of cystoscopy with BILATERAL ureteroscopy, laser lithotripsy and ureteral stent placement was discussed the patient.  Risks included, but are not limited to: bleeding, urinary tract infection, ureteral injury/avulsion, ureteral stricture formation, retained stone fragments, the possibility that multiple surgeries may be required to treat the stone(s), MI, stroke, PE and the inherent risks of general anesthesia.  The patient voices understanding and wishes to proceed.      Ellison Hughs, MD 12/08/2018, 5:54 PM  [facility] Pager: [phone]

## 2018-12-09 ENCOUNTER — Ambulatory Visit (HOSPITAL_BASED_OUTPATIENT_CLINIC_OR_DEPARTMENT_OTHER): Payer: BC Managed Care – PPO | Admitting: Anesthesiology

## 2018-12-09 ENCOUNTER — Other Ambulatory Visit: Payer: Self-pay

## 2018-12-09 ENCOUNTER — Ambulatory Visit (HOSPITAL_BASED_OUTPATIENT_CLINIC_OR_DEPARTMENT_OTHER)
Admission: RE | Admit: 2018-12-09 | Discharge: 2018-12-09 | Disposition: A | Discharge status: Home / Self Care | Payer: BC Managed Care – PPO | Attending: Urology | Admitting: Urology

## 2018-12-09 ENCOUNTER — Encounter (HOSPITAL_BASED_OUTPATIENT_CLINIC_OR_DEPARTMENT_OTHER): Payer: Self-pay | Admitting: *Deleted

## 2018-12-09 ENCOUNTER — Encounter (HOSPITAL_BASED_OUTPATIENT_CLINIC_OR_DEPARTMENT_OTHER)
Admission: RE | Disposition: A | Discharge status: Home / Self Care | Payer: Self-pay | Source: Home / Self Care | Attending: Urology

## 2018-12-09 DIAGNOSIS — N210 Calculus in bladder: Secondary | ICD-10-CM | POA: Diagnosis present

## 2018-12-09 DIAGNOSIS — E785 Hyperlipidemia, unspecified: Secondary | ICD-10-CM | POA: Diagnosis not present

## 2018-12-09 DIAGNOSIS — Z87442 Personal history of urinary calculi: Secondary | ICD-10-CM | POA: Insufficient documentation

## 2018-12-09 DIAGNOSIS — M1990 Unspecified osteoarthritis, unspecified site: Secondary | ICD-10-CM | POA: Insufficient documentation

## 2018-12-09 DIAGNOSIS — I10 Essential (primary) hypertension: Secondary | ICD-10-CM | POA: Insufficient documentation

## 2018-12-09 DIAGNOSIS — Z79899 Other long term (current) drug therapy: Secondary | ICD-10-CM | POA: Insufficient documentation

## 2018-12-09 DIAGNOSIS — N35911 Unspecified urethral stricture, male, meatal: Secondary | ICD-10-CM | POA: Diagnosis not present

## 2018-12-09 DIAGNOSIS — N200 Calculus of kidney: Secondary | ICD-10-CM | POA: Diagnosis not present

## 2018-12-09 DIAGNOSIS — M109 Gout, unspecified: Secondary | ICD-10-CM | POA: Insufficient documentation

## 2018-12-09 HISTORY — DX: Personal history of other medical treatment: Z92.89

## 2018-12-09 HISTORY — PX: URETEROSCOPY WITH HOLMIUM LASER LITHOTRIPSY: SHX6645

## 2018-12-09 HISTORY — DX: Calculus in bladder: N21.0

## 2018-12-09 HISTORY — DX: Calculus of kidney: N20.0

## 2018-12-09 HISTORY — DX: Personal history of other (healed) physical injury and trauma: Z87.828

## 2018-12-09 HISTORY — DX: Personal history of other diseases of the musculoskeletal system and connective tissue: Z87.39

## 2018-12-09 HISTORY — PX: CYSTOSCOPY WITH LITHOLAPAXY: SHX1425

## 2018-12-09 LAB — POCT I-STAT, CHEM 8
BUN: 25 mg/dL — ABNORMAL HIGH (ref 8–23)
Calcium, Ion: 1.27 mmol/L (ref 1.15–1.40)
Chloride: 102 mmol/L (ref 98–111)
Creatinine, Ser: 1.2 mg/dL (ref 0.61–1.24)
Glucose, Bld: 87 mg/dL (ref 70–99)
HCT: 43 % (ref 39.0–52.0)
Hemoglobin: 14.6 g/dL (ref 13.0–17.0)
Potassium: 3.9 mmol/L (ref 3.5–5.1)
Sodium: 140 mmol/L (ref 135–145)
TCO2: 27 mmol/L (ref 22–32)

## 2018-12-09 SURGERY — CYSTOSCOPY, WITH BLADDER CALCULUS LITHOLAPAXY
Anesthesia: General | Site: Ureter

## 2018-12-09 MED ORDER — LIDOCAINE 2% (20 MG/ML) 5 ML SYRINGE
INTRAMUSCULAR | Status: AC
  Filled 2018-12-09: qty 5

## 2018-12-09 MED ORDER — OXYBUTYNIN CHLORIDE 5 MG PO TABS: 5.0000 mg | ORAL_TABLET | Freq: Three times a day (TID) | ORAL | 1 refills | Status: AC | PRN

## 2018-12-09 MED ORDER — SUCCINYLCHOLINE CHLORIDE 200 MG/10ML IV SOSY
PREFILLED_SYRINGE | INTRAVENOUS | Status: AC
  Filled 2018-12-09: qty 10

## 2018-12-09 MED ORDER — FENTANYL CITRATE (PF) 100 MCG/2ML IJ SOLN
INTRAMUSCULAR | Status: AC
  Filled 2018-12-09: qty 2

## 2018-12-09 MED ORDER — PROPOFOL 10 MG/ML IV BOLUS
INTRAVENOUS | Status: AC
  Filled 2018-12-09: qty 20

## 2018-12-09 MED ORDER — ONDANSETRON HCL 4 MG/2ML IJ SOLN
INTRAMUSCULAR | Status: AC
  Filled 2018-12-09: qty 2

## 2018-12-09 MED ORDER — ONDANSETRON HCL 4 MG/2ML IJ SOLN
4.0000 mg | Freq: Once | INTRAMUSCULAR | Status: DC | PRN
  Filled 2018-12-09: qty 2

## 2018-12-09 MED ORDER — SODIUM CHLORIDE 0.9 % IR SOLN
Status: DC | PRN
  Administered 2018-12-09: 15000 mL

## 2018-12-09 MED ORDER — LACTATED RINGERS IV SOLN
INTRAVENOUS | Status: DC
  Administered 2018-12-09 (×2): via INTRAVENOUS
  Filled 2018-12-09: qty 1000

## 2018-12-09 MED ORDER — MEPERIDINE HCL 25 MG/ML IJ SOLN
6.2500 mg | INTRAMUSCULAR | Status: DC | PRN
  Filled 2018-12-09: qty 1

## 2018-12-09 MED ORDER — CEFAZOLIN SODIUM-DEXTROSE 2-4 GM/100ML-% IV SOLN
2.0000 g | Freq: Once | INTRAVENOUS | Status: AC
  Administered 2018-12-09: 2 g via INTRAVENOUS
  Filled 2018-12-09: qty 100

## 2018-12-09 MED ORDER — DEXAMETHASONE SODIUM PHOSPHATE 10 MG/ML IJ SOLN
INTRAMUSCULAR | Status: AC
  Filled 2018-12-09: qty 1

## 2018-12-09 MED ORDER — PHENAZOPYRIDINE HCL 200 MG PO TABS
200.0000 mg | ORAL_TABLET | Freq: Once | ORAL | Status: AC
  Administered 2018-12-09: 16:00:00 200 mg via ORAL
  Filled 2018-12-09: qty 1

## 2018-12-09 MED ORDER — FENTANYL CITRATE (PF) 100 MCG/2ML IJ SOLN
25.0000 ug | INTRAMUSCULAR | Status: DC | PRN
  Administered 2018-12-09: 25 ug via INTRAVENOUS
  Filled 2018-12-09: qty 1

## 2018-12-09 MED ORDER — OXYBUTYNIN CHLORIDE 5 MG PO TABS
5.0000 mg | ORAL_TABLET | Freq: Once | ORAL | Status: AC
  Administered 2018-12-09: 16:00:00 5 mg via ORAL
  Filled 2018-12-09: qty 1

## 2018-12-09 MED ORDER — LIDOCAINE 2% (20 MG/ML) 5 ML SYRINGE
INTRAMUSCULAR | Status: DC | PRN
  Administered 2018-12-09: 80 mg via INTRAVENOUS

## 2018-12-09 MED ORDER — PROPOFOL 10 MG/ML IV BOLUS
INTRAVENOUS | Status: DC | PRN
  Administered 2018-12-09 (×2): 50 mg via INTRAVENOUS
  Administered 2018-12-09: 150 mg via INTRAVENOUS

## 2018-12-09 MED ORDER — PHENAZOPYRIDINE HCL 200 MG PO TABS: 200.0000 mg | ORAL_TABLET | Freq: Three times a day (TID) | ORAL | 0 refills | Status: AC | PRN

## 2018-12-09 MED ORDER — FENTANYL CITRATE (PF) 100 MCG/2ML IJ SOLN
INTRAMUSCULAR | Status: DC | PRN
  Administered 2018-12-09 (×2): 50 ug via INTRAVENOUS
  Administered 2018-12-09 (×2): 25 ug via INTRAVENOUS
  Administered 2018-12-09: 50 ug via INTRAVENOUS

## 2018-12-09 MED ORDER — CEFAZOLIN SODIUM-DEXTROSE 2-4 GM/100ML-% IV SOLN
INTRAVENOUS | Status: AC
  Filled 2018-12-09: qty 100

## 2018-12-09 MED ORDER — BELLADONNA ALKALOIDS-OPIUM 16.2-60 MG RE SUPP
RECTAL | Status: DC | PRN
  Administered 2018-12-09: 1 via RECTAL

## 2018-12-09 MED ORDER — CEPHALEXIN 500 MG PO CAPS: 500.0000 mg | ORAL_CAPSULE | Freq: Two times a day (BID) | ORAL | 0 refills | Status: AC

## 2018-12-09 MED ORDER — OXYBUTYNIN CHLORIDE 5 MG PO TABS
ORAL_TABLET | ORAL | Status: AC
  Filled 2018-12-09: qty 1

## 2018-12-09 MED ORDER — IOHEXOL 300 MG/ML  SOLN
INTRAMUSCULAR | Status: DC | PRN
  Administered 2018-12-09: 20 mL

## 2018-12-09 MED ORDER — PHENAZOPYRIDINE HCL 100 MG PO TABS
ORAL_TABLET | ORAL | Status: AC
  Filled 2018-12-09: qty 2

## 2018-12-09 MED ORDER — TAMSULOSIN HCL 0.4 MG PO CAPS: 0.4000 mg | ORAL_CAPSULE | Freq: Every day | ORAL | 0 refills | Status: AC

## 2018-12-09 MED ORDER — ONDANSETRON HCL 4 MG PO TABS: 4.0000 mg | ORAL_TABLET | Freq: Every day | ORAL | 1 refills | Status: AC | PRN

## 2018-12-09 MED ORDER — MIDAZOLAM HCL 2 MG/2ML IJ SOLN
INTRAMUSCULAR | Status: AC
  Filled 2018-12-09: qty 2

## 2018-12-09 MED ORDER — SUCCINYLCHOLINE CHLORIDE 200 MG/10ML IV SOSY
PREFILLED_SYRINGE | INTRAVENOUS | Status: DC | PRN
  Administered 2018-12-09: 120 mg via INTRAVENOUS

## 2018-12-09 MED ORDER — DEXAMETHASONE SODIUM PHOSPHATE 10 MG/ML IJ SOLN
INTRAMUSCULAR | Status: DC | PRN
  Administered 2018-12-09: 10 mg via INTRAVENOUS

## 2018-12-09 MED ORDER — BELLADONNA ALKALOIDS-OPIUM 16.2-60 MG RE SUPP
RECTAL | Status: AC
  Filled 2018-12-09: qty 1

## 2018-12-09 MED ORDER — MIDAZOLAM HCL 2 MG/2ML IJ SOLN
INTRAMUSCULAR | Status: DC | PRN
  Administered 2018-12-09 (×2): 1 mg via INTRAVENOUS

## 2018-12-09 MED ORDER — ONDANSETRON HCL 4 MG/2ML IJ SOLN
INTRAMUSCULAR | Status: DC | PRN
  Administered 2018-12-09 (×2): 4 mg via INTRAVENOUS

## 2018-12-09 MED ORDER — HYDROCODONE-ACETAMINOPHEN 5-325 MG PO TABS: 1.0000 | ORAL_TABLET | ORAL | 0 refills | Status: AC | PRN

## 2018-12-09 SURGICAL SUPPLY — 15 items
BAG URO CATCHER STRL LF (MISCELLANEOUS) ×4 IMPLANT
CATH URET DUAL LUMEN 6-10FR 50 (CATHETERS) ×4 IMPLANT
CLOTH BEACON ORANGE TIMEOUT ST (SAFETY) ×4 IMPLANT
FIBER LASER FLEXIVA 1000 (UROLOGICAL SUPPLIES) ×4 IMPLANT
FIBER LASER FLEXIVA 550 (UROLOGICAL SUPPLIES) IMPLANT
GLOVE BIO SURGEON STRL SZ7.5 (GLOVE) ×4 IMPLANT
GOWN STRL REUS W/TWL XL LVL3 (GOWN DISPOSABLE) ×4 IMPLANT
GUIDEWIRE STR DUAL SENSOR (WIRE) ×4 IMPLANT
IV NS IRRIG 3000ML ARTHROMATIC (IV SOLUTION) ×20 IMPLANT
MANIFOLD NEPTUNE II (INSTRUMENTS) ×4 IMPLANT
PACK CYSTO (CUSTOM PROCEDURE TRAY) ×4 IMPLANT
SHEATH URETERAL 12FRX35CM (MISCELLANEOUS) ×4 IMPLANT
SYRINGE IRR TOOMEY STRL 70CC (SYRINGE) ×4 IMPLANT
TUBE CONNECTING 12'X1/4 (SUCTIONS)
TUBE CONNECTING 12X1/4 (SUCTIONS) IMPLANT

## 2018-12-09 NOTE — Anesthesia Procedure Notes (Signed)
Procedure Name: Intubation Date/Time: 12/09/2018 12:11 PM Performed by: Mechele Claude, CRNA Pre-anesthesia Checklist: Patient identified, Emergency Drugs available, Suction available and Patient being monitored Patient Re-evaluated:Patient Re-evaluated prior to induction Oxygen Delivery Method: Circle system utilized Preoxygenation: Pre-oxygenation with 100% oxygen Induction Type: IV induction, Cricoid Pressure applied and Rapid sequence Laryngoscope Size: Mac and 3 Grade View: Grade I Tube type: Oral Tube size: 7.5 mm Number of attempts: 1 Airway Equipment and Method: Stylet and Oral airway Placement Confirmation: ETT inserted through vocal cords under direct vision,  positive ETCO2 and breath sounds checked- equal and bilateral Secured at: 23 cm Tube secured with: Tape Dental Injury: Teeth and Oropharynx as per pre-operative assessment

## 2018-12-09 NOTE — Discharge Instructions (Signed)
[facility] °[phone] °Post Ureteroscopy With or Without Stent Instructions ° °Definitions: ° °Ureter: The duct that transports urine from the kidney to the bladder. °Stent:   A plastic hollow tube that is placed into the ureter, from the kidney to the                 bladder to prevent the ureter from swelling shut. ° °GENERAL INSTRUCTIONS: ° °Despite the fact that no skin incisions were used, the area around the ureter and bladder is raw and irritated. The stent is a foreign body which will further irritate the bladder wall. This irritation is manifested by increased frequency of urination, both day and night, and by an increase in the urge to urinate. In some, the urge to urinate is present almost always. Sometimes the urge is strong enough that you may not be able to stop yourself from urinating. The only real cure is to remove the stent and then give time for the bladder wall to heal which can't be done until the danger of the ureter swelling shut has passed, which varies. ° °You may see some blood in your urine while the stent is in place and a few days afterwards. Do not be alarmed, even if the urine was clear for a while. Get off your feet and drink lots of fluids until clearing occurs. If you start to pass clots or don't improve, call us. ° °DIET: °You may return to your normal diet immediately. Because of the raw surface of your bladder, alcohol, spicy foods, acid type foods and drinks with caffeine may cause irritation or frequency and should be used in moderation. To keep your urine flowing freely and to avoid constipation, drink plenty of fluids during the day ( 8-10 glasses ). °Tip: Avoid cranberry juice because it is very acidic. ° °ACTIVITY: °Your physical activity doesn't need to be restricted. However, if you are very active, you may see some blood in your urine. We suggest that you reduce your activity under these circumstances until the bleeding has stopped. ° °BOWELS: °It is  important to keep your bowels regular during the postoperative period. Straining with bowel movements can cause bleeding. A bowel movement every other day is reasonable. Use a mild laxative if needed, such as Milk of Magnesia 2-3 tablespoons, or 2 Dulcolax tablets. Call if you continue to have problems. If you have been taking narcotics for pain, before, during or after your surgery, you may be constipated. Take a laxative if necessary. ° ° °MEDICATION: °You should resume your pre-surgery medications unless told not to. In addition you will often be given an antibiotic to prevent infection. These should be taken as prescribed until the bottles are finished unless you are having an unusual reaction to one of the drugs. ° °PROBLEMS YOU SHOULD REPORT TO US: °· Fevers over 100.5 Fahrenheit. °· Heavy bleeding, or clots ( See above notes about blood in urine ). °· Inability to urinate. °· Drug reactions ( hives, rash, nausea, vomiting, diarrhea ). °· Severe burning or pain with urination that is not improving. ° °FOLLOW-UP: °You will need a follow-up appointment to monitor your progress. Call for this appointment at the number listed above. Usually the first appointment will be about three to fourteen days after your surgery. ° ° ° ° ° °Post Anesthesia Home Care Instructions ° °Activity: °Get plenty of rest for the remainder of the day. A responsible individual must stay with you for 24 hours following the procedure.  °  For the next 24 hours, DO NOT: °-Drive a car °-Operate machinery °-Drink alcoholic beverages °-Take any medication unless instructed by your physician °-Make any legal decisions or sign important papers. ° °Meals: °Start with liquid foods such as gelatin or soup. Progress to regular foods as tolerated. Avoid greasy, spicy, heavy foods. If nausea and/or vomiting occur, drink only clear liquids until the nausea and/or vomiting subsides. Call your physician if vomiting continues. ° °Special  Instructions/Symptoms: °Your throat may feel dry or sore from the anesthesia or the breathing tube placed in your throat during surgery. If this causes discomfort, gargle with warm salt water. The discomfort should disappear within 24 hours. ° °If you had a scopolamine patch placed behind your ear for the management of post- operative nausea and/or vomiting: ° °1. The medication in the patch is effective for 72 hours, after which it should be removed.  Wrap patch in a tissue and discard in the trash. Wash hands thoroughly with soap and water. °2. You may remove the patch earlier than 72 hours if you experience unpleasant side effects which may include dry mouth, dizziness or visual disturbances. °3. Avoid touching the patch. Wash your hands with soap and water after contact with the patch. °  ° °

## 2018-12-09 NOTE — Transfer of Care (Signed)
Last Vitals:  Vitals Value Taken Time  BP 135/79 12/09/18 1400  Temp    Pulse 76 12/09/18 1402  Resp 11 12/09/18 1402  SpO2 97 % 12/09/18 1402  Vitals shown include unvalidated device data.  Last Pain:  Vitals:   12/09/18 1356  TempSrc:   PainSc: (P) Asleep      Patients Stated Pain Goal: 6 (12/09/18 1036)  Immediate Anesthesia Transfer of Care Note  Patient: Alexander Lynn  Procedure(s) Performed: Procedure(s) (LRB): CYSTOSCOPY WITH LITHOLAPAXY (N/A) URETEROSCOPY WITH HOLMIUM LASER LITHOTRIPSY/ STENT PLACEMENT (Bilateral)  Patient Location: PACU  Anesthesia Type: General  Level of Consciousness: drowsy  Airway & Oxygen Therapy: Patient Spontanous Breathing and Patient connected to nasal cannula oxygen  Post-op Assessment: Report given to PACU RN and Post -op Vital signs reviewed and stable  Post vital signs: Reviewed and stable  Complications: No apparent anesthesia complications

## 2018-12-09 NOTE — Op Note (Addendum)
Operative Note  Preoperative diagnosis:  1.  2.7 cm bladder stone 2.  Bilateral renal stones 3.  History of gout  Postoperative diagnosis: 1.  2.7 cm bladder stone 2.  Bilateral renal stones 3.  History of gout 4.  Meatal stenosis  Procedure(s): 1.  Urethral dilation 2.  Cystoscopy with laser lithotripsy of 2.7 cm bladder stone 3.  Cystoscopy with bilateral ureteroscopy, bilateral holmium laser lithotripsy and bilateral JJ stent placement 4.  Bilateral retrograde pyelograms with intra-operative interpretation of fluoroscopic imaging  Surgeon: Alexander Hughs, MD  Assistants:  None  Anesthesia:  General  Complications:  None  EBL: 10 mL  Specimens: 1.  Bladder stone 2.  Bilateral renal stones  Drains/Catheters: 1.  Bilateral 6 French, 24 cm JJ stents without tethers 2.  18 French Foley catheter with 10 mL in the balloon  Intraoperative findings:   1.  Meatal stenosis that required sequential dilation up to 24 [place] with [employer] sounds 2.  2.7 cm bladder stone 3.  No apparent prostatic urethral obstruction 4.  Solitary right collecting system with no filling defects or dilation involving the right ureter or right renal pelvis seen on retrograde pyelogram 5.  Solitary left collecting system with no filling defects or dilation involving the left ureter or left renal pelvis seen on retrograde pyelogram 6.  Multiple bilateral renal stones measuring approximately 2 to 3 mm in greatest dimension  Indication:  Alexander Lynn is a 68 y.o. male with a long history of kidney stones and was recently diagnosed with gouty arthritis.  CT from October 2020 revealed a 2.7 cm bladder stone along with multiple bilateral renal stones.  He has been consented for the above procedures, voices understanding wishes to proceed.  Description of procedure:  After informed consent was obtained, the patient was brought to the operating room and general LMA anesthesia was administered.  The patient was then placed in the dorsolithotomy position and prepped and draped in the usual sterile fashion. A timeout was performed.  I then attempted to insert a 1 French rigid cystoscope into the urethral meatus, but it was found to be stenotic.  Alexander Lynn sounds were then used to sequentially dilate the urethral meatus, starting at 27 [place] and progressing up to 24 [place], and 2 [place] increments.  A 19.5 French rigid cystoscope was then inserted into the urethral meatus and advanced into the bladder under direct vision.  A complete bladder survey revealed his 2.7 cm bladder stone, with no other intravesical pathology.  A 1000 white laser was then used to fracture the bladder stone into numerous smaller pieces, that were then siphoned out of the bladder through the sheath of the cystoscope using a [place] syringe.  A 5 French ureteral catheter was then inserted into the right ureteral orifice and a retrograde pyelogram was obtained, with the findings listed above.  A Glidewire was then used to intubate the lumen of the ureteral catheter and was advanced up to the right renal pelvis, under fluoroscopic guidance.  The catheter was then removed, leaving the wire in place.  An additional sensor wire was then placed up the right ureter, under fluoroscopic guidance.  A 14 French ureteral access sheath was then advanced over the sensor wire and into position within the proximal aspects of the right ureter.  Flexible ureteroscope was then advanced through the lumen of the access sheath and into the right renal pelvis.  A full inspection of the right renal pelvis and all of its  calyces revealed multiple 2 to 3 mm stones.  I was able to extract a couple of stones for analysis.  A 200 m holmium laser was then used to dust the remaining stones within the right collecting system.  The ureteral access sheath was then removed under direct vision, leaving the working wire in place.  A 6 French, 24 cm JJ stent was then  placed over the wire and into good position within the right collecting system, confirming placement via fluoroscopy.  A 5 French ureteral catheter was then inserted into the left ureteral orifice and a retrograde pyelogram was obtained, with the findings listed above.  A Glidewire was then used to intubate the lumen of the ureteral catheter and was advanced up to the left renal pelvis, under fluoroscopic guidance.  The catheter was then removed, leaving the wire in place.  A sensor wire was then advanced up the left ureter to the left renal pelvis, or fluoroscopic guidance.  A 14 French ureteral access sheath was then advanced over the sensor wire and into position within the proximal aspects of the left ureter.  The flexible ureter was then advanced through the lumen of the access sheath and into the left renal pelvis.  A complete inspection of the left renal pelvis and its associated calyces revealed multiple 2 to 3 mm stones.  A 200 m holmium laser was then used to dust the stones into numerous smaller pieces.  The flexible ureteroscope was then removed under direct vision, revealing no evidence of ureteral trauma or ureteral stone burden.  A 6 French, 24 cm JJ stent was then advanced over the working wire and into position within the left collecting system, confirming placement via fluoroscopy.  Given the degree of meatal stenosis, I made the decision to place an 80 French Foley catheter, which was left to gravity drainage.  The patient tolerated the procedure well.  He was then transferred to the postanesthesia in stable condition.  Plan:  The patient has been instructed to remove his Foley catheter at 7 AM on 12/12/2018.  He will follow-up on 12/16/2018 for cystoscopy and bilateral stent removal.

## 2018-12-09 NOTE — Anesthesia Preprocedure Evaluation (Signed)
Anesthesia Evaluation  Patient identified by MRN, date of birth, ID band Patient awake    Reviewed: Allergy & Precautions, NPO status , Patient's Chart, lab work & pertinent test results  History of Anesthesia Complications (+) PONV and history of anesthetic complications  Airway Mallampati: II  TM Distance: >3 FB Neck ROM: Full    Dental no notable dental hx. (+) Dental Advisory Given   Pulmonary neg pulmonary ROS,    Pulmonary exam normal breath sounds clear to auscultation       Cardiovascular hypertension, Pt. on medications Normal cardiovascular exam+ Valvular Problems/Murmurs  Rhythm:Regular Rate:Normal     Neuro/Psych negative neurological ROS  negative psych ROS   GI/Hepatic negative GI ROS, Neg liver ROS,   Endo/Other  negative endocrine ROS  Renal/GU Renal disease     Musculoskeletal  (+) Arthritis ,   Abdominal   Peds  Hematology  (+) Blood dyscrasia, anemia ,   Anesthesia Other Findings   Reproductive/Obstetrics negative OB ROS                             Anesthesia Physical Anesthesia Plan  ASA: II  Anesthesia Plan: General   Post-op Pain Management:    Induction: Intravenous  PONV Risk Score and Plan: 4 or greater and Ondansetron, Dexamethasone and Treatment may vary due to age or medical condition  Airway Management Planned: Oral ETT  Additional Equipment: None  Intra-op Plan:   Post-operative Plan: Extubation in OR  Informed Consent: I have reviewed the patients History and Physical, chart, labs and discussed the procedure including the risks, benefits and alternatives for the proposed anesthesia with the patient or authorized representative who has indicated his/her understanding and acceptance.     Dental advisory given  Plan Discussed with: CRNA  Anesthesia Plan Comments:         Anesthesia Quick Evaluation

## 2018-12-10 ENCOUNTER — Encounter (HOSPITAL_BASED_OUTPATIENT_CLINIC_OR_DEPARTMENT_OTHER): Payer: Self-pay | Admitting: Urology

## 2018-12-10 NOTE — Anesthesia Postprocedure Evaluation (Signed)
Anesthesia Post Note  Patient: Alexander Lynn  Procedure(s) Performed: CYSTOSCOPY WITH LITHOLAPAXY (N/A Bladder) URETEROSCOPY WITH HOLMIUM LASER LITHOTRIPSY/ STENT PLACEMENT (Bilateral Ureter)     Patient location during evaluation: PACU Anesthesia Type: General Level of consciousness: awake and alert Pain management: pain level controlled Vital Signs Assessment: post-procedure vital signs reviewed and stable Respiratory status: spontaneous breathing, nonlabored ventilation, respiratory function stable and patient connected to nasal cannula oxygen Cardiovascular status: blood pressure returned to baseline and stable Postop Assessment: no apparent nausea or vomiting Anesthetic complications: no    Last Vitals:  Vitals:   12/09/18 1430 12/09/18 1600  BP: (!) 144/89 129/62  Pulse: 78 60  Resp: 11 14  Temp:  36.6 C  SpO2: 95% 100%    Last Pain:  Vitals:   12/09/18 1630  TempSrc:   PainSc: [name]

## 2020-02-25 IMAGING — DX DG ANKLE COMPLETE 3+V*R*
2 series · 2 of 2 positions shown · non-contrast
Comparison: None.

CLINICAL DATA: Right ankle pain and swelling for 3 days. No
reported injury.

EXAM:
RIGHT ANKLE - COMPLETE 3+ VIEW

[ankle ap]
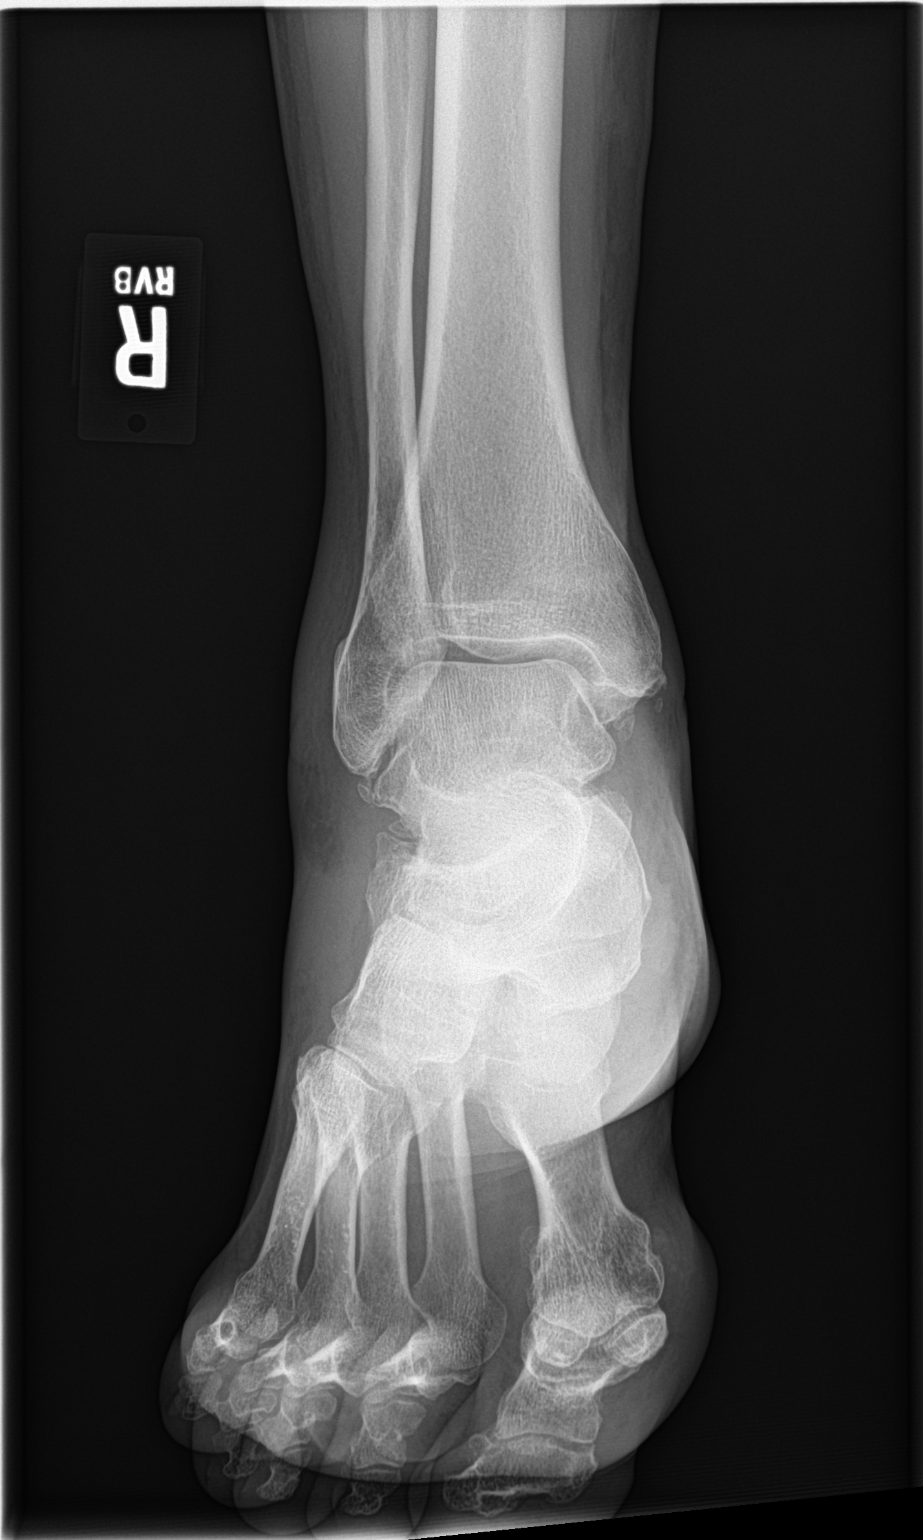

[ankle obl]
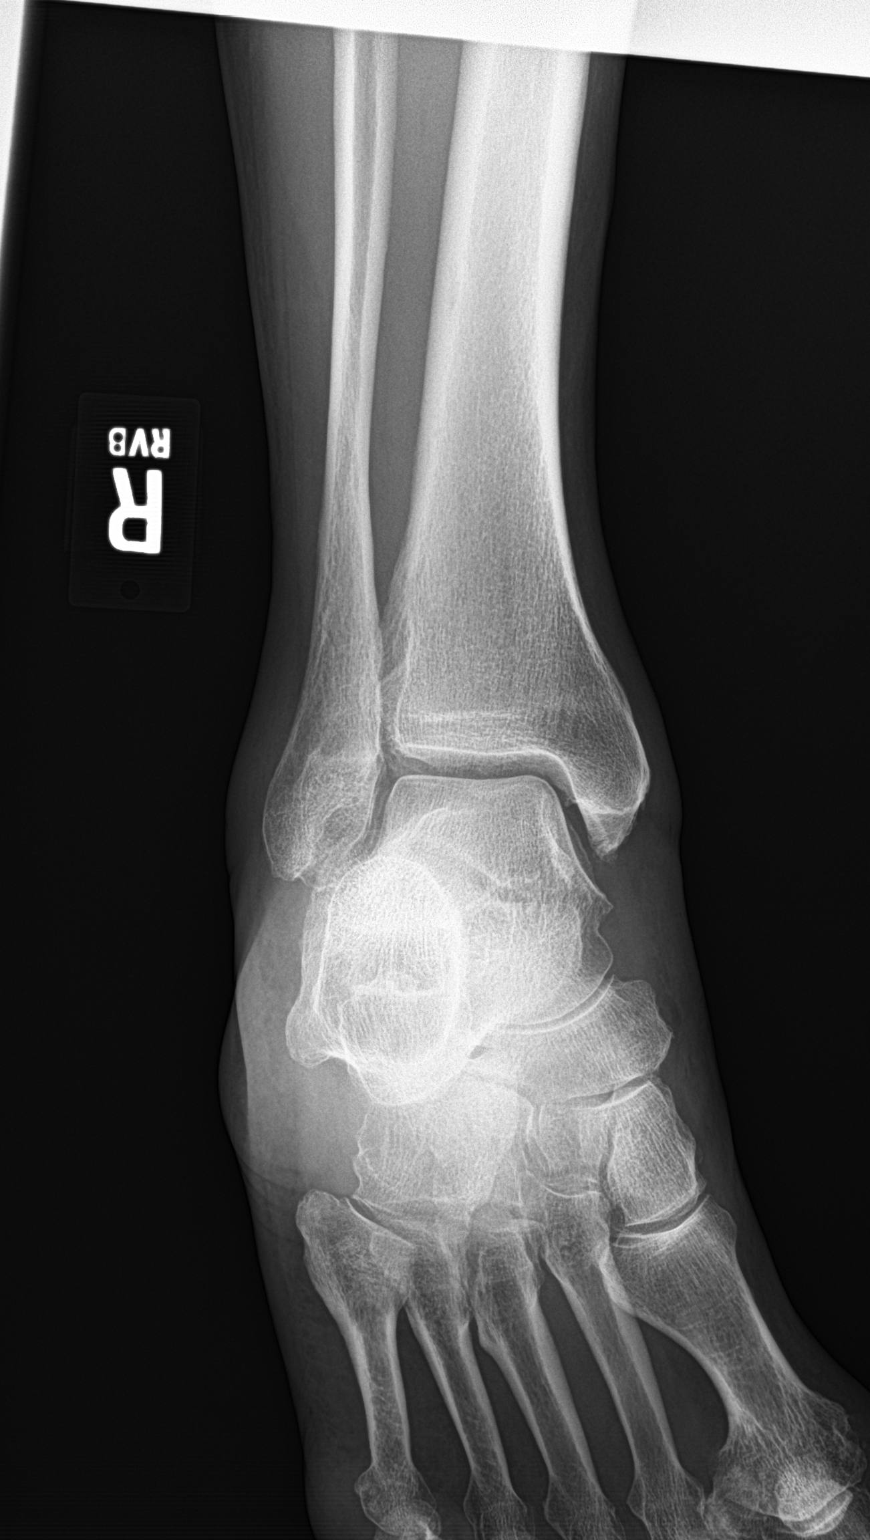

[2 of 2 positions shown; findings below may reference images not displayed]

FINDINGS: No fracture or subluxation. No suspicious focal osseous lesions.
Mild right ankle soft tissue swelling, most prominent laterally.
Small plantar right calcaneal spur. No radiopaque foreign bodies.
IMPRESSION: No acute osseous abnormality. Moderate ankle soft tissue swelling,
most prominent laterally. Small plantar right calcaneal spur.
